# Patient Record
Sex: Male | Born: 1955 | Race: White | Hispanic: No | Marital: Married | Smoking: Never smoker
Health system: Southern US, Community
[De-identification: ages and names within clinical notes are randomized; demographics above are authoritative.]

## PROBLEM LIST (undated history)

## (undated) DIAGNOSIS — G4730 Sleep apnea, unspecified: Secondary | ICD-10-CM

## (undated) DIAGNOSIS — Z9889 Other specified postprocedural states: Secondary | ICD-10-CM

## (undated) DIAGNOSIS — E119 Type 2 diabetes mellitus without complications: Secondary | ICD-10-CM

## (undated) DIAGNOSIS — H9319 Tinnitus, unspecified ear: Secondary | ICD-10-CM

## (undated) DIAGNOSIS — F419 Anxiety disorder, unspecified: Secondary | ICD-10-CM

## (undated) DIAGNOSIS — F32A Depression, unspecified: Secondary | ICD-10-CM

## (undated) DIAGNOSIS — F329 Major depressive disorder, single episode, unspecified: Secondary | ICD-10-CM

## (undated) DIAGNOSIS — R011 Cardiac murmur, unspecified: Secondary | ICD-10-CM

## (undated) DIAGNOSIS — E785 Hyperlipidemia, unspecified: Secondary | ICD-10-CM

## (undated) DIAGNOSIS — I10 Essential (primary) hypertension: Secondary | ICD-10-CM

## (undated) DIAGNOSIS — R112 Nausea with vomiting, unspecified: Secondary | ICD-10-CM

## (undated) HISTORY — PX: HERNIA REPAIR: SHX51

## (undated) HISTORY — PX: LACERATION REPAIR: SHX5168

## (undated) HISTORY — PX: WISDOM TOOTH EXTRACTION: SHX21

## (undated) HISTORY — PX: BACK SURGERY: SHX140

## (undated) HISTORY — PX: COLONOSCOPY: SHX174

## (undated) HISTORY — DX: Sleep apnea, unspecified: G47.30

## (undated) HISTORY — DX: Hyperlipidemia, unspecified: E78.5

## (undated) HISTORY — DX: Essential (primary) hypertension: I10

## (undated) HISTORY — DX: Type 2 diabetes mellitus without complications: E11.9

---

## 1997-11-15 ENCOUNTER — Ambulatory Visit: Admission: RE | Admit: 1997-11-15 | Discharge: 1997-11-15 | Payer: Self-pay | Admitting: Otolaryngology

## 2002-10-24 ENCOUNTER — Emergency Department (HOSPITAL_COMMUNITY): Admission: EM | Admit: 2002-10-24 | Discharge: 2002-10-24 | Payer: Self-pay | Admitting: Emergency Medicine

## 2005-05-05 ENCOUNTER — Ambulatory Visit: Payer: Self-pay | Admitting: Internal Medicine

## 2005-07-03 ENCOUNTER — Ambulatory Visit: Payer: Self-pay | Admitting: Family Medicine

## 2013-05-10 ENCOUNTER — Institutional Professional Consult (permissible substitution): Payer: Self-pay | Admitting: Pulmonary Disease

## 2013-06-07 ENCOUNTER — Encounter: Payer: Self-pay | Admitting: Pulmonary Disease

## 2013-06-07 ENCOUNTER — Ambulatory Visit (INDEPENDENT_AMBULATORY_CARE_PROVIDER_SITE_OTHER): Payer: BC Managed Care – PPO | Admitting: Pulmonary Disease

## 2013-06-07 ENCOUNTER — Encounter (INDEPENDENT_AMBULATORY_CARE_PROVIDER_SITE_OTHER): Payer: Self-pay

## 2013-06-07 VITALS — BP 124/80 | HR 104 | Temp 98.8°F | Ht 73.0 in | Wt 254.0 lb

## 2013-06-07 DIAGNOSIS — G4733 Obstructive sleep apnea (adult) (pediatric): Secondary | ICD-10-CM | POA: Insufficient documentation

## 2013-06-07 NOTE — Patient Instructions (Signed)
Will get you a new cpap machine, and use the automatic setting to optimize your pressure.  If you feel more comfortable on a fixed pressure, let me know Work on weight loss If you are doing well, followup with me again in one year.

## 2013-06-07 NOTE — Assessment & Plan Note (Signed)
The patient has a history of severe obstructive sleep apnea, and has done well with CPAP over the years. He now has an aged CPAP machine, and is in need of a mask and new supplies. He is also gained a large amount of weight since his study, and therefore would take this opportunity to optimize his pressure again. Will give him a new CPAP machine, and use the automatic setting to see how he responds. I've also encouraged him to work aggressively on weight loss.

## 2013-06-07 NOTE — Progress Notes (Signed)
Subjective:    Patient ID: Kerry Reynolds, male    DOB: March 03, 1956, 58 y.o.   MRN: [id number]  HPI The patient is a 58 year old male who comes in today as a self-referral for management of obstructive sleep apnea. He was diagnosed in 1999 with severe OSA, with an AHI of 53 events per hour. He was ultimately titrated to an optimal pressure of 7 cm by split-night protocol. The patient has been on CPAP since that time, and has done very well with the device. However, his machine is now quite old, and not working properly, and he is using tape to hold his CPAP mask together. The patient feels that he is rested overall upon arising, and feels that his alertness during the day is adequate as long as he gets enough sleep at night. He does have sleepiness in the evenings while watching television. He denies any sleepiness with driving.  The patient's weight has increased 59 pounds since 1999, and his Epworth score today is 13.   Sleep Questionnaire What time do you typically go to bed?( Between what hours) 10-11p  10-11p  at 1605 on 06/07/13 by Maisie FusAshtyn M Green, CMA How long does it take you to fall asleep? 5mins 5mins at 1605 on 06/07/13 by Maisie FusAshtyn M Green, CMA How many times during the night do you wake up? 0 0 at 1605 on 06/07/13 by Maisie FusAshtyn M Green, CMA What time do you get out of bed to start your day? 0600 0600 at 1605 on 06/07/13 by Maisie FusAshtyn M Green, CMA Do you drive or operate heavy machinery in your occupation? No No at 1605 on 06/07/13 by Maisie FusAshtyn M Green, CMA How much has your weight changed (up or down) over the past two years? (In pounds) 15 lb (6.804 kg) 15 lb (6.804 kg) at 1605 on 06/07/13 by Maisie FusAshtyn M Green, CMA Have you ever had a sleep study before? Yes Yes at 1605 on 06/07/13 by Maisie FusAshtyn M Green, CMA If yes, location of study? [facility] at 1605 on 06/07/13 by Maisie FusAshtyn M Green, CMA If yes, date of study? 1999 1999 at 1605 on 06/07/13 by Maisie FusAshtyn M Green, CMA Do you currently use CPAP?  Yes Yes at 1605 on 06/07/13 by Maisie FusAshtyn M Green, CMA If so, what pressure? 7 7 at 1605 on 06/07/13 by Maisie FusAshtyn M Green, CMA Do you wear oxygen at any time? No    Review of Systems  Constitutional: Negative for fever and unexpected weight change.  HENT: Negative for congestion, dental problem, ear pain, nosebleeds, postnasal drip, rhinorrhea, sinus pressure, sneezing, sore throat and trouble swallowing.        Recent cold  Eyes: Negative for redness and itching.  Respiratory: Negative for cough, chest tightness, shortness of breath and wheezing.   Cardiovascular: Positive for leg swelling. Negative for palpitations.  Gastrointestinal: Negative for nausea and vomiting.  Genitourinary: Negative for dysuria.  Musculoskeletal: Negative for joint swelling.  Skin: Negative for rash.  Neurological: Negative for headaches.  Hematological: Does not bruise/bleed easily.  Psychiatric/Behavioral: Positive for dysphoric mood. The patient is nervous/anxious.        Treated with meds       Objective:   Physical Exam Constitutional:  Overweight male, no acute distress  HENT:  Nares patent without discharge but narrowed.  Oropharynx without exudate, palate and uvula significantly elongated.   Eyes:  Perrla, eomi, no scleral icterus  Neck:  No JVD, no TMG  Cardiovascular:  Normal rate, regular rhythm, no rubs  or gallops.  No murmurs        Intact distal pulses  Pulmonary :  Normal breath sounds, no stridor or respiratory distress   No rales, rhonchi, or wheezing  Abdominal:  Soft, nondistended, bowel sounds present.  No tenderness noted.   Musculoskeletal:  No lower extremity edema noted.  Lymph Nodes:  No cervical lymphadenopathy noted  Skin:  No cyanosis noted  Neurologic:  Alert, appropriate, moves all 4 extremities without obvious deficit.         Assessment & Plan:

## 2013-06-09 ENCOUNTER — Telehealth: Payer: Self-pay | Admitting: Pulmonary Disease

## 2013-06-09 NOTE — Telephone Encounter (Signed)
Called and spoke with patient and advised patient that [facility] as per his request. Advised patient that if he had any issues tolerating the device once he was set up to give us a call back or [facility]. Nothing else needed at this time. Rhonda J Cobb

## 2013-08-02 ENCOUNTER — Ambulatory Visit: Payer: BC Managed Care – PPO | Admitting: *Deleted

## 2013-09-05 ENCOUNTER — Encounter: Payer: Self-pay | Admitting: *Deleted

## 2013-09-05 ENCOUNTER — Encounter: Payer: BC Managed Care – PPO | Attending: Family Medicine | Admitting: *Deleted

## 2013-09-05 VITALS — Ht 71.75 in | Wt 231.6 lb

## 2013-09-05 DIAGNOSIS — E119 Type 2 diabetes mellitus without complications: Secondary | ICD-10-CM

## 2013-09-05 DIAGNOSIS — Z713 Dietary counseling and surveillance: Secondary | ICD-10-CM | POA: Insufficient documentation

## 2013-09-05 NOTE — Progress Notes (Signed)
Medical Nutrition Therapy:  Appt start time: 0800 end time:  0900.  Assessment:  Primary concern today: type 2 diabetes. Patient newly diagnosed with diabetes 3 months ago with an A1c of 9.1. Since then, he has researched on his own about what to eat and has eliminated sweets, soda, most snacking, and increased exercise. He does report that he gets hungry between lunch and dinner, but his appetite is lower overall. He is scheduled to have A1c retested tomorrow. He checks his blood glucose once daily, alternating between fasting and PM (1 hour after meals) with values around the 120s. He has also been exercising regularly, working up to walking 60 minutes every evening after dinner. He has also lost 22 pounds in the last 3 months.  MEDICATIONS: Invokamet 2 pills BID, for others, see list   DIETARY INTAKE:   Usual eating pattern includes 3 meals and 1 snacks per day.  24-hr recall:  B ( AM): Yogurt, banana, water  Snk ( AM): Oatmeal (1 packet, sweetened) OR cream of wheat with honey L ( PM): Salad (lettuce, ranch, tomatoes, broccoli/cauliflower, egg), soup, water Snk ( PM): None D ( PM): Chicken, vegetables, potatoes  Snk ( PM): None Beverages: Water  Usual physical activity: Walking daily, 52 minutes, working up to 60 minutes  Estimated energy needs: 2000 calories 225 g carbohydrates 125 g protein 67 g fat  Progress Towards Goal(s):  In progress.   Nutritional Diagnosis:  NB-1.1 Food and nutrition-related knowledge deficit As related to newly diagnosed diabeted.  As evidenced by patient report.    Intervention:  Nutrition counseling. We discussed basic carb counting, including foods with carbs, label reading, portion size, and meal planning.   Goals: 1. 3-4 carb servings (+/- 1 serving) per meal, 1 serving per snacks.  2. Monitor portion size of carb foods.  3. Read food labels.  4. Utilize plate method for meal planning.  5. Use basic carb counting to expand food options.  6.  Continue exercising as currently  Handouts given during visit include:  Living Well with Diabetes booklet  Meal plan card  Monitoring/Evaluation:  Dietary intake, exercise, blood glucose, and body weight prn.

## 2014-06-09 ENCOUNTER — Ambulatory Visit: Payer: BC Managed Care – PPO | Admitting: Pulmonary Disease

## 2014-07-11 ENCOUNTER — Ambulatory Visit (INDEPENDENT_AMBULATORY_CARE_PROVIDER_SITE_OTHER): Payer: 59 | Admitting: Pulmonary Disease

## 2014-07-11 ENCOUNTER — Encounter: Payer: Self-pay | Admitting: Pulmonary Disease

## 2014-07-11 VITALS — BP 126/70 | HR 82 | Ht 73.0 in | Wt 242.0 lb

## 2014-07-11 DIAGNOSIS — G4733 Obstructive sleep apnea (adult) (pediatric): Secondary | ICD-10-CM

## 2014-07-11 NOTE — Assessment & Plan Note (Signed)
The patient continues to do very well with his C Pap device, and his download today shows excellent compliance and good control of his AHI. He is having a little bit of a mask leak, and I've asked him to be vigilant about keeping up with cushion changes.  Finally, I have commended him on his weight loss from last year, and asked him to continue working on this.

## 2014-07-11 NOTE — Progress Notes (Signed)
   Subjective:    Patient ID: Regan RakersEdwin L Hankin, male    DOB: [birth year], 59 y.o.   MRN: [id number]  HPI Patient comes in today for follow-up of his obstructive sleep apnea. He is wearing C Pap compliantly by his download, and has great control of his AHI. He is having a little bit of a mask leak issues, but recently replaced his cushions and feels that he is doing well. Satisfied with his sleep and daytime alertness. Note, he has lost 12 pounds since the last visit.   Review of Systems  Constitutional: Negative for fever, chills, activity change, appetite change and unexpected weight change.  HENT: Negative for congestion, dental problem, postnasal drip, rhinorrhea, sneezing, sore throat, trouble swallowing and voice change.   Eyes: Negative for visual disturbance.  Respiratory: Negative for cough, choking and shortness of breath.   Cardiovascular: Negative for chest pain and leg swelling.  Gastrointestinal: Negative for nausea, vomiting and abdominal pain.  Genitourinary: Negative for difficulty urinating.  Musculoskeletal: Negative for arthralgias.  Skin: Negative for rash.  Psychiatric/Behavioral: Negative for behavioral problems and confusion.       Objective:   Physical Exam Overweight male in no acute distress Nose without purulence or discharge noted Neck without lymphadenopathy or thyromegaly No skin breakdown or pressure necrosis from the C Pap mask Lower extremities with mild edema, no cyanosis Alert and oriented, moves all 4 extremities.       Assessment & Plan:

## 2014-07-11 NOTE — Patient Instructions (Signed)
Continue on cpap, and keep up with mask changes and supplies. Work on weight loss followup with me again in one year.  

## 2015-07-17 ENCOUNTER — Ambulatory Visit (INDEPENDENT_AMBULATORY_CARE_PROVIDER_SITE_OTHER): Payer: 59 | Admitting: Pulmonary Disease

## 2015-07-17 ENCOUNTER — Encounter: Payer: Self-pay | Admitting: Pulmonary Disease

## 2015-07-17 VITALS — BP 128/82 | HR 105 | Ht 73.0 in | Wt 250.0 lb

## 2015-07-17 DIAGNOSIS — E669 Obesity, unspecified: Secondary | ICD-10-CM

## 2015-07-17 DIAGNOSIS — G4733 Obstructive sleep apnea (adult) (pediatric): Secondary | ICD-10-CM | POA: Diagnosis not present

## 2015-07-17 DIAGNOSIS — I10 Essential (primary) hypertension: Secondary | ICD-10-CM

## 2015-07-17 DIAGNOSIS — F32A Depression, unspecified: Secondary | ICD-10-CM

## 2015-07-17 DIAGNOSIS — F329 Major depressive disorder, single episode, unspecified: Secondary | ICD-10-CM | POA: Diagnosis not present

## 2015-07-17 NOTE — Patient Instructions (Signed)
1. We will order you supplies for your cpap. Give us a call if you dont get a call in 2-3 weeks. 2. Continue using cpap.  Return to clinic in 1 yr

## 2015-07-17 NOTE — Progress Notes (Signed)
   Subjective:    Patient ID: Kerry Reynolds, male    DOB: 1955-11-11, 60 y.o.   MRN: [id number]  HPI ROV (07/17/2015) Patient is here for follow-up on sleep apnea. Severe. On auto CPAP. Download the last month shows 100% compliance with AHI of 1.6. He feels better using CPAP. No issues with it. Feels benefit of CPAP. Has not received supplies in awhile.    Review of Systems  Constitutional: Negative.   HENT: Positive for congestion and drooling.   Eyes: Negative.   Respiratory: Positive for cough and shortness of breath.   Cardiovascular: Negative.   Gastrointestinal: Negative.   Endocrine: Negative.   Genitourinary: Negative.   Skin: Negative.   Allergic/Immunologic: Negative.   Neurological: Negative.   Hematological: Negative.   Psychiatric/Behavioral: Negative.   All other systems reviewed and are negative.      Objective:   Physical Exam  Vitals:  Filed Vitals:   07/17/15 1601  BP: 128/82  Pulse: 105  Height: 6\' 1"  (1.854 m)  Weight: 250 lb (113.399 kg)  SpO2: 97%    Constitutional/General:  Pleasant, well-nourished, well-developed, not in any distress,  Comfortably seating.  Well kempt obse   Body mass index is 32.99 kg/(m^2). Wt Readings from Last 3 Encounters:  07/17/15 250 lb (113.399 kg)  07/11/14 242 lb (109.77 kg)  09/05/13 231 lb 9.6 oz (105.053 kg)    Neck circumference:   HEENT: Pupils equal and reactive to light and accommodation. Anicteric sclerae. Normal nasal mucosa.   No oral  lesions,  mouth clear,  oropharynx clear, no postnasal drip. (-) Oral thrush. No dental caries.  Airway - Mallampati class III  Neck: No masses. Midline trachea. No JVD, (-) LAD. (-) bruits appreciated.  Respiratory/Chest: Grossly normal chest. (-) deformity. (-) Accessory muscle use.  Symmetric expansion. (-) Tenderness on palpation.  Resonant on percussion.  Diminished BS on both lower lung zones. (-) wheezing, crackles, rhonchi (-) egophony  Cardiovascular:  Regular rate and  rhythm, heart sounds normal, no murmur or gallops, no peripheral edema  Gastrointestinal:  Normal bowel sounds. Soft, non-tender. No hepatosplenomegaly.  (-) masses.   Musculoskeletal:  Normal muscle tone. Normal gait.   Extremities: Grossly normal. (-) clubbing, cyanosis.  (-) edema  Skin: (-) rash,lesions seen.   Neurological/Psychiatric : alert, oriented to time, place, person. Normal mood and affect           Assessment & Plan:  OSA (obstructive sleep apnea) NPSG 1999:  AHI 53/hr, cpap to 7cm.  On auto 5-20cm >> optimal 14cm  Current machine he got in 2015.  Feels better with cpap. More energy. Less sleepiness. No issues with cpap. DL x 3 mos > good. osa corrected.  Cont cpap use. Pt needs supplies. Told pt to wash his supplies regularly.   Obesity Weight reduction.     Return to clinic in 1 yr.  Pollie MeyerJ. Angelo A de Dios, MD 07/18/2015, 8:17 AM [facility] Pager [phone] After 3 pm or if no answer, call [phone]

## 2015-07-17 NOTE — Assessment & Plan Note (Addendum)
NPSG 1999:  AHI 53/hr, cpap to 7cm.  On auto 5-20cm >> optimal 14cm  Current machine he got in 2015.  Feels better with cpap. More energy. Less sleepiness. No issues with cpap. DL x 3 mos > good. osa corrected.  Cont cpap use. Pt needs supplies. Told pt to wash his supplies regularly.

## 2015-07-18 DIAGNOSIS — E669 Obesity, unspecified: Secondary | ICD-10-CM | POA: Insufficient documentation

## 2015-07-18 NOTE — Assessment & Plan Note (Signed)
Weight reduction 

## 2015-08-13 ENCOUNTER — Encounter: Payer: Self-pay | Admitting: Pulmonary Disease

## 2015-10-24 DIAGNOSIS — H6123 Impacted cerumen, bilateral: Secondary | ICD-10-CM | POA: Insufficient documentation

## 2015-10-24 DIAGNOSIS — H9313 Tinnitus, bilateral: Secondary | ICD-10-CM | POA: Insufficient documentation

## 2015-10-24 DIAGNOSIS — H9193 Unspecified hearing loss, bilateral: Secondary | ICD-10-CM | POA: Insufficient documentation

## 2015-12-07 ENCOUNTER — Emergency Department (HOSPITAL_BASED_OUTPATIENT_CLINIC_OR_DEPARTMENT_OTHER)
Admission: EM | Admit: 2015-12-07 | Discharge: 2015-12-07 | Disposition: A | Discharge status: Home / Self Care | Payer: 59 | Attending: Emergency Medicine | Admitting: Emergency Medicine

## 2015-12-07 ENCOUNTER — Encounter (HOSPITAL_BASED_OUTPATIENT_CLINIC_OR_DEPARTMENT_OTHER): Payer: Self-pay

## 2015-12-07 ENCOUNTER — Emergency Department (HOSPITAL_BASED_OUTPATIENT_CLINIC_OR_DEPARTMENT_OTHER): Payer: 59

## 2015-12-07 DIAGNOSIS — I10 Essential (primary) hypertension: Secondary | ICD-10-CM | POA: Diagnosis not present

## 2015-12-07 DIAGNOSIS — R0789 Other chest pain: Secondary | ICD-10-CM | POA: Diagnosis not present

## 2015-12-07 DIAGNOSIS — E119 Type 2 diabetes mellitus without complications: Secondary | ICD-10-CM | POA: Diagnosis not present

## 2015-12-07 DIAGNOSIS — Z7982 Long term (current) use of aspirin: Secondary | ICD-10-CM | POA: Insufficient documentation

## 2015-12-07 DIAGNOSIS — Z79899 Other long term (current) drug therapy: Secondary | ICD-10-CM | POA: Diagnosis not present

## 2015-12-07 LAB — CBC
HCT: 45.7 % (ref 39.0–52.0)
Hemoglobin: 16.3 g/dL (ref 13.0–17.0)
MCH: 31.7 pg (ref 26.0–34.0)
MCHC: 35.7 g/dL (ref 30.0–36.0)
MCV: 88.9 fL (ref 78.0–100.0)
PLATELETS: 181 10*3/uL (ref 150–400)
RBC: 5.14 MIL/uL (ref 4.22–5.81)
RDW: 13 % (ref 11.5–15.5)
WBC: 5.8 10*3/uL (ref 4.0–10.5)

## 2015-12-07 LAB — BASIC METABOLIC PANEL
Anion gap: 10 (ref 5–15)
BUN: 16 mg/dL (ref 6–20)
CALCIUM: 9.4 mg/dL (ref 8.9–10.3)
CO2: 25 mmol/L (ref 22–32)
CREATININE: 0.8 mg/dL (ref 0.61–1.24)
Chloride: 103 mmol/L (ref 101–111)
GFR calc non Af Amer: >60 mL/min (ref >60)
Glucose, Bld: 116 mg/dL — ABNORMAL HIGH (ref 65–99)
Potassium: 3.9 mmol/L (ref 3.5–5.1)
SODIUM: 138 mmol/L (ref 135–145)

## 2015-12-07 LAB — CBG MONITORING, ED: Glucose-Capillary: 114 mg/dL — ABNORMAL HIGH (ref 65–99)

## 2015-12-07 LAB — TROPONIN I

## 2015-12-07 MED ORDER — GI COCKTAIL ~~LOC~~
30.0000 mL | Freq: Once | ORAL | Status: DC
  Filled 2015-12-07: qty 30

## 2015-12-07 NOTE — Discharge Instructions (Signed)
Try zantac 150mg twice a day.  ° ° °

## 2015-12-07 NOTE — ED Provider Notes (Signed)
[facility] Provider Note   CSN: [id number] Arrival date & time: 12/07/15  1340     History   Chief Complaint Chief Complaint  Patient presents with  . Chest Pain    HPI Kerry Reynolds is a 60 y.o. male.  60 yo M with a chief complaint of epigastric pain. This started after he had a fatty biscuit. Patient states that the pain is burning lasts for 5 seconds at a time and quickly resolves. 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 They suggested he call his family physician who then told him to come immediately to the emergency department. Denies fevers or chills. Denies radiation of pain. Denies shortness of breath diaphoresis or exertional symptoms. He has had some trace edema denies unilateral swelling denies hemoptysis prior history of PE or DVT. Denies recent surgery.   The history is provided by the patient.  Chest Pain   This is a new problem. The current episode started less than 1 hour ago. The problem occurs rarely. The problem has not changed since onset.The pain is associated with rest. The pain is present in the epigastric region. The pain is at a severity of 5/10. The pain is moderate. The quality of the pain is described as brief. The pain does not radiate. Duration of episode(s) is 5 seconds. Pertinent negatives include no abdominal pain, no fever, no headaches, no palpitations, no shortness of breath and no vomiting. He has tried antacids for the symptoms. The treatment provided mild relief. Risk factors include obesity and male gender.  Pertinent negatives for past medical history include no CAD, no COPD, no CHF, no MI, no PE and no PVD.    Past Medical History:  Diagnosis Date  . Diabetes (HCC)   . HTN (hypertension)   . Hyperlipidemia   . Sleep apnea     Patient Active Problem List   Diagnosis Date Noted  . Obesity 07/18/2015  . OSA (obstructive sleep apnea) 06/07/2013    Past Surgical History:   Procedure Laterality Date  . BACK SURGERY     lower back  . HERNIA REPAIR     [employer]  . LACERATION REPAIR Right    arm  . WISDOM TOOTH EXTRACTION         Home Medications    Prior to Admission medications   Medication Sig Start Date End Date Taking? Authorizing Provider  amLODipine-valsartan (EXFORGE) 5-320 MG tablet Take 1 tablet by mouth daily. 07/14/15   Historical Provider, MD  aspirin 81 MG tablet Take 81 mg by mouth daily.    Historical Provider, MD  buPROPion (WELLBUTRIN XL) 300 MG 24 hr tablet Take 1 tablet by mouth daily. 07/14/15   Historical Provider, MD  FLUoxetine (PROZAC) 20 MG capsule Take 1 capsule by mouth daily. 07/14/15   Historical Provider, MD  hydrochlorothiazide (HYDRODIURIL) 25 MG tablet Take 0.5 tablets by mouth daily. 07/14/15   Historical Provider, MD  INVOKAMET [id number] MG TABS 1 tablet daily. 05/07/14   Historical Provider, MD  pravastatin (PRAVACHOL) 40 MG tablet Take 1 tablet by mouth daily. 07/02/15   Historical Provider, MD  Red Yeast Rice Extract (RED YEAST RICE PO) Take 2 tablets by mouth daily.    Historical Provider, MD    Family History Family History  Problem Relation Age of Onset  . Breast cancer Mother   . Diabetes Maternal Grandmother     Social History Social History  Substance Use Topics  . Smoking status: Never Smoker  . Smokeless tobacco: Never Used  . Alcohol use No     Allergies   Review of patient's allergies indicates no known allergies.   Review of Systems Review of Systems  Constitutional: Negative for chills and fever.  HENT: Negative for congestion and facial swelling.   Eyes: Negative for discharge and visual disturbance.  Respiratory: Negative for shortness of breath.   Cardiovascular: Positive for chest pain. Negative for palpitations.  Gastrointestinal: Negative for abdominal pain, diarrhea and vomiting.  Musculoskeletal: Negative for arthralgias and myalgias.  Skin: Negative for color change and rash.    Neurological: Negative for tremors, syncope and headaches.  Psychiatric/Behavioral: Negative for confusion and dysphoric mood.     Physical Exam Updated Vital Signs BP 120/72   Pulse 87   Temp 98.2 F (36.8 C) (Oral)   Resp 20   Ht 6\' 1"  (1.854 m)   Wt 245 lb (111.1 kg)   SpO2 99%   BMI 32.32 kg/m   Physical Exam  Constitutional: He is oriented to person, place, and time. He appears well-developed and well-nourished.  HENT:  Head: Normocephalic and atraumatic.  Eyes: Conjunctivae and EOM are normal. Pupils are equal, round, and reactive to light.  Neck: Normal range of motion. No JVD present.  Cardiovascular: Normal rate and regular rhythm.   Pulmonary/Chest: Effort normal. No stridor. No respiratory distress.  Abdominal: He exhibits no distension. There is no tenderness. There is no guarding.  Musculoskeletal: Normal range of motion. He exhibits no edema.  Neurological: He is alert and oriented to person, place, and time.  Skin: Skin is warm and dry.  Psychiatric: He has a normal mood and affect. His behavior is normal.     ED Treatments / Results  Labs (all labs ordered are listed, but only abnormal results are displayed) Labs Reviewed  BASIC METABOLIC PANEL - Abnormal; Notable for the following:       Result Value   Glucose, Bld 116 (*)    All other components within normal limits  CBG MONITORING, ED - Abnormal; Notable for the following:    Glucose-Capillary 114 (*)    All other components within normal limits  CBC  TROPONIN I    EKG  EKG Interpretation  Date/Time:  Friday December 07 2015 13:46:00 EDT Ventricular Rate:  81 PR Interval:    QRS Duration: 93 QT Interval:  346 QTC Calculation: 402 R Axis:   -24 Text Interpretation:  Sinus rhythm Supraventricular bigeminy Borderline left axis deviation Low voltage, precordial leads Abnormal R-wave progression, late transition No old tracing to compare Confirmed by Itza Maniaci MD, DANIEL [phone]) on 12/07/2015  1:57:34 PM       Radiology Dg Chest 2 View  Result Date: 12/07/2015 CLINICAL DATA:  Chest tightness.  Diabetes.  Hypertension. EXAM: CHEST  2 VIEW COMPARISON:  None. FINDINGS: S shaped thoracic scoliosis. Mildly tortuous thoracic aorta, heart size within normal limits. Linear subsegmental atelectasis or scarring in the lingula. No pleural effusion or additional airspace opacity identified. IMPRESSION: 1. Scoliosis with some associated rib deformity. This appears chronic. 2. Subsegmental atelectasis or scarring in the lingula, otherwise the lungs appear clear. 3. Mildly tortuous thoracic aorta. Electronically Signed   By: Gaylyn Rong M.D.   On: 12/07/2015 14:07    Procedures Procedures (including critical care time)  Medications Ordered in ED Medications  gi cocktail (Maalox,Lidocaine,Donnatal) (30 mLs Oral Refused 12/07/15 1445)     Initial Impression / Assessment and  Plan / ED Course  I have reviewed the triage vital signs and the nursing notes.  Pertinent labs & imaging results that were available during my care of the patient were reviewed by me and considered in my medical decision making (see chart for details).  Clinical Course    60 yo M With epigastric pain after eating fatty food. Patient with no significant findings on exam. Pain is transiently last for 5 seconds at a time. I feel this is completely atypical of cardiac disease. Patient had labs drawn through triage which are negative. An EKG and a chest x-ray which do not show any acute findings.  D/c home.   2:45 PM:  I have discussed the diagnosis/risks/treatment options with the patient and family and believe the pt to be eligible for discharge home to follow-up with PCP. We also discussed returning to the ED immediately if new or worsening sx occur. We discussed the sx which are most concerning (e.g., sudden worsening pain, fever, inability to tolerate by mouth) that necessitate immediate return. Medications  administered to the patient during their visit and any new prescriptions provided to the patient are listed below.  Medications given during this visit Medications  gi cocktail (Maalox,Lidocaine,Donnatal) (30 mLs Oral Refused 12/07/15 1445)     The patient appears reasonably screen and/or stabilized for discharge and I doubt any other medical condition or other [facility] requiring further screening, evaluation, or treatment in the ED at this time prior to discharge.    Final Clinical Impressions(s) / ED Diagnoses   Final diagnoses:  Atypical chest pain    New Prescriptions New Prescriptions   No medications on file     Melene Plan, DO 12/07/15 1446

## 2016-02-14 ENCOUNTER — Ambulatory Visit (INDEPENDENT_AMBULATORY_CARE_PROVIDER_SITE_OTHER): Payer: Worker's Compensation

## 2016-02-14 ENCOUNTER — Encounter (INDEPENDENT_AMBULATORY_CARE_PROVIDER_SITE_OTHER): Payer: Self-pay | Admitting: Orthopaedic Surgery

## 2016-02-14 ENCOUNTER — Ambulatory Visit (INDEPENDENT_AMBULATORY_CARE_PROVIDER_SITE_OTHER): Payer: Worker's Compensation | Admitting: Orthopaedic Surgery

## 2016-02-14 VITALS — BP 147/84 | HR 103 | Ht 72.0 in | Wt 245.0 lb

## 2016-02-14 DIAGNOSIS — M25511 Pain in right shoulder: Secondary | ICD-10-CM | POA: Diagnosis not present

## 2016-02-14 MED ORDER — HYDROCODONE-ACETAMINOPHEN 5-325 MG PO TABS: 1.0000 | ORAL_TABLET | Freq: Four times a day (QID) | ORAL | 0 refills | Status: DC | PRN

## 2016-02-14 NOTE — Progress Notes (Signed)
   Office Visit Note   Patient: Kerry Reynolds           Date of Birth: Jun 07, 1955           MRN: [id number] Visit Date: 02/14/2016              Requested by: Elias Elseobert Reade, MD [phone] [place], [place] [address] PCP: Lolita PatellaEADE,ROBERT ALEXANDER, MD   Assessment & Plan: Visit Diagnoses: No diagnosis found.  Plan:   Follow-Up Instructions: No Follow-up on file.   Orders:  No orders of the defined types were placed in this encounter.  No orders of the defined types were placed in this encounter.     Procedures: No procedures performed   Clinical Data: No additional findings.   Subjective: No chief complaint on file.   Pt fell Tuesday, 02/12/16 backwards on to a wall, fell straight down and when he tried to get up he could not raise the Right arm/shoulder because it hurt so bad.   He continues to have pain to the point of compromise with inability to raise his right arm over his head. He denies any numbness or tingling or skin changes. He denies any neck pain. He also denies any prior problems with the same shoulder.  Review of Systems   Objective: Vital Signs: There were no vitals taken for this visit.  Physical Exam  Ortho Exam the right shoulder was not swollen there was no evidence of ecchymosis neurologically was intact. He was unable to raise his arm over his head related to pain. I thought that the biceps muscle was in a lower position possibly consistent with a biceps tendon rupture. His sensation was intact and he had normal motor function of his right hand.  Specialty Comments:  No specialty comments available.  Imaging: No results found.   PMFS History: Patient Active Problem List   Diagnosis Date Noted  . Obesity 07/18/2015  . OSA (obstructive sleep apnea) 06/07/2013   Past Medical History:  Diagnosis Date  . Diabetes (HCC)   . HTN (hypertension)   . Hyperlipidemia   . Sleep apnea     Family History  Problem Relation Age of  Onset  . Breast cancer Mother   . Diabetes Maternal Grandmother     Past Surgical History:  Procedure Laterality Date  . BACK SURGERY     lower back  . [employer]ERNIA REPAIR     [employer]  . LACERATION REPAIR Right    arm  . WISDOM TOOTH EXTRACTION     Social History   Occupational History  . teacher    Social History Main Topics  . Smoking status: Never Smoker  . Smokeless tobacco: Never Used  . Alcohol use No  . Drug use: No  . Sexual activity: Not on file

## 2016-02-18 ENCOUNTER — Telehealth (INDEPENDENT_AMBULATORY_CARE_PROVIDER_SITE_OTHER): Payer: Self-pay | Admitting: *Deleted

## 2016-02-18 NOTE — Telephone Encounter (Signed)
Pt has appt for MRI on Saturday Nov 18th at 10am with 930am [facility] at [facility], left message on vm to return my call for appt information.

## 2016-02-18 NOTE — Telephone Encounter (Signed)
Pt called back and aware of appt 

## 2016-02-23 ENCOUNTER — Ambulatory Visit (HOSPITAL_COMMUNITY)
Admission: RE | Admit: 2016-02-23 | Discharge: 2016-02-23 | Disposition: A | Discharge status: Home / Self Care | Payer: No Typology Code available for payment source | Source: Ambulatory Visit | Attending: Orthopaedic Surgery | Admitting: Orthopaedic Surgery

## 2016-02-23 DIAGNOSIS — M25511 Pain in right shoulder: Secondary | ICD-10-CM

## 2016-02-23 DIAGNOSIS — M7551 Bursitis of right shoulder: Secondary | ICD-10-CM | POA: Insufficient documentation

## 2016-02-23 DIAGNOSIS — M19011 Primary osteoarthritis, right shoulder: Secondary | ICD-10-CM | POA: Diagnosis not present

## 2016-02-23 DIAGNOSIS — S46811A Strain of other muscles, fascia and tendons at shoulder and upper arm level, right arm, initial encounter: Secondary | ICD-10-CM | POA: Insufficient documentation

## 2016-02-23 DIAGNOSIS — W19XXXA Unspecified fall, initial encounter: Secondary | ICD-10-CM | POA: Diagnosis not present

## 2016-02-25 NOTE — H&P (Signed)
Kerry CampbellPeter Whitfield, MD   Kerry CodeBrian Braxley Balandran, PA-C [address], [place], [place]  [address]                             [phone]   ORTHOPAEDIC HISTORY & PHYSICAL  Kerry Reynolds MRN:  [id number] DOB/SEX:  1956/02/18/male  CHIEF COMPLAINT:  Painful right shoulder  HISTORY: Pt fell Tuesday, 02/12/16 backwards on to a wall, fell straight down and when he tried to get up he could not raise the Right arm/shoulder because it hurt so bad.  He continues to have pain to the point of compromise with inability to raise his right arm over his head. He denies any numbness or tingling or skin changes. He denies any neck pain. He also denies any prior problems with the same shoulder.  MRI was ordered Rotator cuff: The patient has complete supraspinatus and infraspinatus tendon tears with retraction to the top of the humeral head, 2-3 cm. Subscapularis and teres minor are intact.  Muscles:  No atrophy or focal lesion.  Biceps long head:  Intact.  Acromioclavicular Joint: Moderate degenerative change is seen. Type 2 acromion. Prominent fluid is seen in the subacromial/subdeltoid bursa.  Glenohumeral Joint: Unremarkable.  Labrum:  Intact.  Bones:  No fracture or worrisome lesion.  Other: None  IMPRESSION: Complete supraspinatus and infraspinatus tendon tears with 2-3 cm of retraction but no atrophy.  Moderate acromioclavicular osteoarthritis.  Subacromial/subdeltoid bursitis.   PAST MEDICAL HISTORY: Patient Active Problem List   Diagnosis Date Noted  . Obesity 07/18/2015  . OSA (obstructive sleep apnea) 06/07/2013   Past Medical History:  Diagnosis Date  . Diabetes (HCC)   . HTN (hypertension)   . Hyperlipidemia   . Sleep apnea    Past Surgical History:  Procedure Laterality Date  . BACK SURGERY     lower back  . HERNIA REPAIR     [employer]  . LACERATION REPAIR Right    arm  . WISDOM TOOTH EXTRACTION       MEDICATIONS:   No current facility-administered  medications for this encounter.    Current Outpatient Prescriptions  Medication Sig Dispense Refill  . amLODipine-valsartan (EXFORGE) 5-320 MG tablet Take 1 tablet by mouth daily.    Marland Kitchen. aspirin 81 MG tablet Take 81 mg by mouth daily.    Marland Kitchen. buPROPion (WELLBUTRIN XL) 300 MG 24 hr tablet Take 1 tablet by mouth daily.    Marland Kitchen. FLUoxetine (PROZAC) 20 MG capsule Take 1 capsule by mouth daily.    . hydrochlorothiazide (HYDRODIURIL) 25 MG tablet Take 12.5 mg by mouth daily.     Marland Kitchen. HYDROcodone-acetaminophen (NORCO/VICODIN) 5-325 MG tablet Take 1-2 tablets by mouth every 6 (six) hours as needed for moderate pain. 20 tablet 0  . INVOKAMET [phone] MG TABS Take 1 tablet by mouth 2 (two) times daily.     . Multiple Vitamin (MULTIVITAMIN) capsule Take by mouth.    . pravastatin (PRAVACHOL) 40 MG tablet Take 1 tablet by mouth every evening.        ALLERGIES:  No Known Allergies  REVIEW OF SYSTEMS:  A comprehensive review of systems was negative.   FAMILY HISTORY:   Family History  Problem Relation Age of Onset  . Breast cancer Mother   . Diabetes Maternal Grandmother     SOCIAL HISTORY:   Social History  Substance Use Topics  . Smoking status: Never Smoker  . Smokeless tobacco: Never Used  . Alcohol  use No      EXAMINATION:   Head is normocephalic.   Eyes:  Pupils equal, round and reactive to light and accommodation.  Extraocular intact. ENT: Ears, nose, and throat were benign.   Neck: supple, no bruits were noted.   Chest: good expansion.   Lungs: essentially clear.   Cardiac: regular rhythm and rate, normal S1, S2.  No murmurs appreciated. Pulses :  2+ bilateral and symmetric in upper extremities. Abdomen is scaphoid, soft, nontender, no masses palpable, normal bowel sounds  present. CNS:  He is oriented x3 and cranial nerves II-XII grossly intact. Breast, rectal, and genital exams: not performed and not indicated for an orthopedic evaluation. Musculoskeletal: Ortho Exam the right  shoulder was not swollen there was no evidence of ecchymosis neurologically was intact. He was unable to raise his arm over his head related to pain. I thought that the biceps muscle was in a lower position possibly consistent with a biceps tendon rupture. His sensation was intact and he had normal motor function of his right hand.   Imaging Review Rotator cuff: The patient has complete supraspinatus and infraspinatus tendon tears with retraction to the top of the humeral head, 2-3 cm. Subscapularis and teres minor are intact.  Muscles:  No atrophy or focal lesion.  Biceps long head:  Intact.  Acromioclavicular Joint: Moderate degenerative change is seen. Type 2 acromion. Prominent fluid is seen in the subacromial/subdeltoid bursa.  Glenohumeral Joint: Unremarkable.  Labrum:  Intact.  Bones:  No fracture or worrisome lesion.  Other: None  IMPRESSION: Complete supraspinatus and infraspinatus tendon tears with 2-3 cm of retraction but no atrophy.  Moderate acromioclavicular osteoarthritis.  Subacromial/subdeltoid bursitis.   ASSESSMENT: right impingment syndrome with rotator cuff tear  Past Medical History:  Diagnosis Date  . Diabetes (HCC)   . HTN (hypertension)   . Hyperlipidemia   . Sleep apnea     PLAN: Plan for right SHOULDER ARTHROSCOPY WITH SUBACROMIAL DECOMPRESSION, DISTAL CLAVICLE RESECTION, MINI-OPEN ROTATOR CUFF REPAIR, [facility]  The procedure,  risks, and benefits of surgery were presented and reviewed. The risks including but not limited to infection, blood clots, vascular and nerve injury, stiffness,  among others were discussed. The patient acknowledged the explanation, agreed to proceed.   Oris DroneBrian D. Aleda Granaetrarca, PA-C [facility] [phone]  02/25/2016 7:56 PM

## 2016-02-27 ENCOUNTER — Other Ambulatory Visit (INDEPENDENT_AMBULATORY_CARE_PROVIDER_SITE_OTHER): Payer: Self-pay | Admitting: Orthopaedic Surgery

## 2016-02-27 DIAGNOSIS — M25511 Pain in right shoulder: Principal | ICD-10-CM

## 2016-02-27 DIAGNOSIS — G8929 Other chronic pain: Secondary | ICD-10-CM

## 2016-02-27 MED ORDER — HYDROCODONE-ACETAMINOPHEN 5-325 MG PO TABS: 1.0000 | ORAL_TABLET | Freq: Four times a day (QID) | ORAL | 0 refills | Status: DC | PRN

## 2016-02-27 NOTE — Telephone Encounter (Signed)
Patient says he is scheduled for shoulder surgery on 03/06/16. He is requesting additional pain medication to last for the next week leading up to surgery. Please advise.

## 2016-02-27 NOTE — Telephone Encounter (Signed)
Per PW, wrote rx for 20 pills.

## 2016-03-05 ENCOUNTER — Encounter (HOSPITAL_COMMUNITY): Payer: Self-pay

## 2016-03-05 ENCOUNTER — Encounter (HOSPITAL_COMMUNITY)
Admission: RE | Admit: 2016-03-05 | Discharge: 2016-03-05 | Disposition: A | Discharge status: Home / Self Care | Payer: 59 | Source: Ambulatory Visit | Attending: Orthopaedic Surgery | Admitting: Orthopaedic Surgery

## 2016-03-05 DIAGNOSIS — E119 Type 2 diabetes mellitus without complications: Secondary | ICD-10-CM | POA: Diagnosis not present

## 2016-03-05 DIAGNOSIS — M65811 Other synovitis and tenosynovitis, right shoulder: Secondary | ICD-10-CM | POA: Diagnosis not present

## 2016-03-05 DIAGNOSIS — Z7982 Long term (current) use of aspirin: Secondary | ICD-10-CM | POA: Diagnosis not present

## 2016-03-05 DIAGNOSIS — Z7984 Long term (current) use of oral hypoglycemic drugs: Secondary | ICD-10-CM | POA: Diagnosis not present

## 2016-03-05 DIAGNOSIS — G4733 Obstructive sleep apnea (adult) (pediatric): Secondary | ICD-10-CM | POA: Diagnosis not present

## 2016-03-05 DIAGNOSIS — E669 Obesity, unspecified: Secondary | ICD-10-CM | POA: Diagnosis not present

## 2016-03-05 DIAGNOSIS — F329 Major depressive disorder, single episode, unspecified: Secondary | ICD-10-CM | POA: Diagnosis not present

## 2016-03-05 DIAGNOSIS — M7541 Impingement syndrome of right shoulder: Secondary | ICD-10-CM | POA: Diagnosis not present

## 2016-03-05 DIAGNOSIS — M75111 Incomplete rotator cuff tear or rupture of right shoulder, not specified as traumatic: Secondary | ICD-10-CM | POA: Diagnosis not present

## 2016-03-05 DIAGNOSIS — F419 Anxiety disorder, unspecified: Secondary | ICD-10-CM | POA: Diagnosis not present

## 2016-03-05 DIAGNOSIS — E785 Hyperlipidemia, unspecified: Secondary | ICD-10-CM | POA: Diagnosis not present

## 2016-03-05 DIAGNOSIS — Z833 Family history of diabetes mellitus: Secondary | ICD-10-CM | POA: Diagnosis not present

## 2016-03-05 DIAGNOSIS — Z6832 Body mass index (BMI) 32.0-32.9, adult: Secondary | ICD-10-CM | POA: Diagnosis not present

## 2016-03-05 DIAGNOSIS — M19011 Primary osteoarthritis, right shoulder: Secondary | ICD-10-CM | POA: Diagnosis not present

## 2016-03-05 DIAGNOSIS — Z888 Allergy status to other drugs, medicaments and biological substances status: Secondary | ICD-10-CM | POA: Diagnosis not present

## 2016-03-05 DIAGNOSIS — I10 Essential (primary) hypertension: Secondary | ICD-10-CM | POA: Diagnosis not present

## 2016-03-05 DIAGNOSIS — Z803 Family history of malignant neoplasm of breast: Secondary | ICD-10-CM | POA: Diagnosis not present

## 2016-03-05 HISTORY — DX: Nausea with vomiting, unspecified: Z98.890

## 2016-03-05 HISTORY — DX: Depression, unspecified: F32.A

## 2016-03-05 HISTORY — DX: Nausea with vomiting, unspecified: R11.2

## 2016-03-05 HISTORY — DX: Tinnitus, unspecified ear: H93.19

## 2016-03-05 HISTORY — DX: Anxiety disorder, unspecified: F41.9

## 2016-03-05 HISTORY — DX: Major depressive disorder, single episode, unspecified: F32.9

## 2016-03-05 HISTORY — DX: Cardiac murmur, unspecified: R01.1

## 2016-03-05 LAB — CBC
HEMATOCRIT: 44.5 % (ref 39.0–52.0)
HEMOGLOBIN: 15.4 g/dL (ref 13.0–17.0)
MCH: 31.6 pg (ref 26.0–34.0)
MCHC: 34.6 g/dL (ref 30.0–36.0)
MCV: 91.4 fL (ref 78.0–100.0)
Platelets: 195 10*3/uL (ref 150–400)
RBC: 4.87 MIL/uL (ref 4.22–5.81)
RDW: 13 % (ref 11.5–15.5)
WBC: 5.4 10*3/uL (ref 4.0–10.5)

## 2016-03-05 LAB — BASIC METABOLIC PANEL
Anion gap: 10 (ref 5–15)
BUN: 13 mg/dL (ref 6–20)
CHLORIDE: 103 mmol/L (ref 101–111)
CO2: 25 mmol/L (ref 22–32)
Calcium: 9.8 mg/dL (ref 8.9–10.3)
Creatinine, Ser: 0.96 mg/dL (ref 0.61–1.24)
GFR calc non Af Amer: >60 mL/min (ref >60)
Glucose, Bld: 205 mg/dL — ABNORMAL HIGH (ref 65–99)
POTASSIUM: 4 mmol/L (ref 3.5–5.1)
SODIUM: 138 mmol/L (ref 135–145)

## 2016-03-05 LAB — GLUCOSE, CAPILLARY: GLUCOSE-CAPILLARY: 222 mg/dL — AB (ref 65–99)

## 2016-03-05 NOTE — Pre-Procedure Instructions (Signed)
Kerry Reynolds  03/05/2016      [facility] [place] [place] [address] Phone: [phone] Fax: [phone]    Your procedure is scheduled on Thursday, November 30th, 2017.  [facility] at 11:15 A.M.   Call this number if you have problems the morning of surgery:  [phone]   Remember:  Do not eat food or drink liquids after midnight.   Take these medicines the morning of surgery with A SIP OF WATER: Bupropion (Wellbutrin), Fluoxetine (Prozac), Hydrocodone-acetaminophen (Norco) if needed.   Stop taking: Aspirin, NSAIDS, Aleve, Naproxen, Ibuprofen, Advil, Motrin, BC's, Goody's, Fish oil, all herbal medications, and all vitamins.    WHAT DO I DO ABOUT MY DIABETES MEDICATION?  Marland Kitchen. Do not take oral diabetes medicines (pills) the morning of surgery.  Do NOT take Invokamet the morning of surgery.    How to Manage Your Diabetes Before and After Surgery  Why is it important to control my blood sugar before and after surgery? . Improving blood sugar levels before and after surgery helps healing and can limit problems. . A way of improving blood sugar control is eating a healthy diet by: o  Eating less sugar and carbohydrates o  Increasing activity/exercise o  Talking with your doctor about reaching your blood sugar goals . High blood sugars (greater than 180 mg/dL) can raise your risk of infections and slow your recovery, so you will need to focus on controlling your diabetes during the weeks before surgery. . Make sure that the doctor who takes care of your diabetes knows about your planned surgery including the date and location.  How do I manage my blood sugar before surgery? . Check your blood sugar at least 4 times a day, starting 2 days before surgery, to make sure that the level is not too high or low. o Check your blood sugar the morning of your surgery when you  wake up and every 2 hours until you get to the Short Stay unit. . If your blood sugar is less than 70 mg/dL, you will need to treat for low blood sugar: o Do not take insulin. o Treat a low blood sugar (less than 70 mg/dL) with  cup of clear juice (cranberry or apple), 4 glucose tablets, OR glucose gel. o Recheck blood sugar in 15 minutes after treatment (to make sure it is greater than 70 mg/dL). If your blood sugar is not greater than 70 mg/dL on recheck, call [phone] for further instructions. . Report your blood sugar to the short stay nurse when you get to Short Stay.  . If you are admitted to the hospital after surgery: o Your blood sugar will be checked by the staff and you will probably be given insulin after surgery (instead of oral diabetes medicines) to make sure you have good blood sugar levels. o The goal for blood sugar control after surgery is 80-180 mg/dL.    Do not wear jewelry.  Do not wear lotions, powders, or colognes, or deoderant.  Men may shave face and neck.  Do not bring valuables to the hospital.  [facility] is not responsible for any belongings or valuables.  Contacts, dentures or bridgework may not be worn into surgery.  Leave your suitcase in the car.  After surgery it may be brought to your room.  For patients admitted to the hospital, discharge time will be determined by  your treatment team.  Patients discharged the day of surgery will not be allowed to drive home.   Special instructions:  Preparing for Surgery.   [facility]- Preparing For Surgery  Before surgery, you can play an important role. Because skin is not sterile, your skin needs to be as free of germs as possible. You can reduce the number of germs on your skin by washing with CHG (chlorahexidine gluconate) Soap before surgery.  CHG is an antiseptic cleaner which kills germs and bonds with the skin to continue killing germs even after washing.  Please do not use if you have an allergy to  CHG or antibacterial soaps. If your skin becomes reddened/irritated stop using the CHG.  Do not shave (including legs and underarms) for at least 48 hours prior to first CHG shower. It is OK to shave your face.  Please follow these instructions carefully.   1. Shower the NIGHT BEFORE SURGERY and the MORNING OF SURGERY with CHG.   2. If you chose to wash your hair, wash your hair first as usual with your normal shampoo.  3. After you shampoo, rinse your hair and body thoroughly to remove the shampoo.  4. Use CHG as you would any other liquid soap. You can apply CHG directly to the skin and wash gently with a scrungie or a clean washcloth.   5. Apply the CHG Soap to your body ONLY FROM THE NECK DOWN.  Do not use on open wounds or open sores. Avoid contact with your eyes, ears, mouth and genitals (private parts). Wash genitals (private parts) with your normal soap.  6. Wash thoroughly, paying special attention to the area where your surgery will be performed.  7. Thoroughly rinse your body with warm water from the neck down.  8. DO NOT shower/wash with your normal soap after using and rinsing off the CHG Soap.  9. Pat yourself dry with a CLEAN TOWEL.   10. Wear CLEAN PAJAMAS   11. Place CLEAN SHEETS on your bed the night of your first shower and DO NOT SLEEP WITH PETS.  Day of Surgery: Do not apply any deodorants/lotions. Please wear clean clothes to the hospital/surgery center.     Please read over the following fact sheets that you were given

## 2016-03-05 NOTE — Progress Notes (Signed)
PCP - Dr. Elias Elseobert Reade Cardiologist - denies  EKG - 12/11/15 CXR - 12/07/15  Echo/stress test/cardiac cath - pt. Denies  Patient denies chest pain and shortness of breath at PAT appointment.

## 2016-03-05 NOTE — Progress Notes (Signed)
Anesthesia Chart Review: Patient is a 60 year old male scheduled for right shoulder arthroscopy with subacromial decompression, distal clavicle resection, mini open rotator cuff repair, possible Dermaspan patch on 03/06/16 by Dr. Cleophas DunkerWhitfield.   History includes non-smoker, post-operative N/V, HTN, HLD, OSA (CPAP), DM2, murmur, anxiety, depression, back surgery, hernia repair. BMI is consistent with obesity. He had an ED visit on 12/07/15 for chest pain after eating fatty food. It did not resolve with Tums, so he presented to the ED. By notes, pain would transiently last for 5 seconds at a time, so the ED physician felt it was atypical for cardiac etiology. Troponin was negative. He was given a GI cocktail and discharged home.   PCP is Dr. Elias Elseobert Reade.  Pulmonologist is Dr. Delphina CahillJose Angelo A de RichmondDios. Patient denied seeing a cardiologist. He denied prior stress, echo, or cath. He denied CP and SOB at PAT.   Meds includes Exforge, ASA 81 mg, Wellbutrin XL, Prozac, HCTZ, Norco, Invokamet, MVI, pravastatin.  BP 125/78   Pulse 97   Temp 36.7 C   Resp 20   Ht 6\' 1"  (1.854 m)   Wt 244 lb 8 oz (110.9 kg)   SpO2 97%   BMI 32.26 kg/m   12/07/15 EKG: SR, frequent PACs, borderline LAD, low voltage, precordial leads, abnormal R wave progression, late transition.  12/07/15 CXR: IMPRESSION: 1. Scoliosis with some associated rib deformity. This appears chronic. 2. Subsegmental atelectasis or scarring in the lingula, otherwise the lungs appear clear. 3. Mildly tortuous thoracic aorta.  Preoperative labs noted. Cr 0.96, glucose 205. CBC WNL. A1c in process.   Discussed with anesthesiologist Dr. Okey Dupreose. If he remains asymptomatic from a CV standpoint then it is anticipated that he can proceed as planned. Anesthesiologist to evaluate on the day of surgery.  Kerry Ochsllison Zelenak, PA-C [facility] Phone [phone] 03/05/2016 2:36 PM

## 2016-03-06 ENCOUNTER — Ambulatory Visit (HOSPITAL_COMMUNITY): Payer: 59 | Admitting: Vascular Surgery

## 2016-03-06 ENCOUNTER — Encounter (HOSPITAL_COMMUNITY)
Admission: RE | Disposition: A | Discharge status: Home / Self Care | Payer: Self-pay | Source: Ambulatory Visit | Attending: Orthopaedic Surgery

## 2016-03-06 ENCOUNTER — Encounter (HOSPITAL_COMMUNITY): Payer: Self-pay | Admitting: *Deleted

## 2016-03-06 ENCOUNTER — Ambulatory Visit (HOSPITAL_COMMUNITY): Payer: 59 | Admitting: Certified Registered Nurse Anesthetist

## 2016-03-06 ENCOUNTER — Ambulatory Visit (HOSPITAL_COMMUNITY)
Admission: RE | Admit: 2016-03-06 | Discharge: 2016-03-06 | Disposition: A | Discharge status: Home / Self Care | Payer: 59 | Source: Ambulatory Visit | Attending: Orthopaedic Surgery | Admitting: Orthopaedic Surgery

## 2016-03-06 DIAGNOSIS — G4733 Obstructive sleep apnea (adult) (pediatric): Secondary | ICD-10-CM | POA: Insufficient documentation

## 2016-03-06 DIAGNOSIS — F419 Anxiety disorder, unspecified: Secondary | ICD-10-CM | POA: Insufficient documentation

## 2016-03-06 DIAGNOSIS — M7541 Impingement syndrome of right shoulder: Secondary | ICD-10-CM | POA: Insufficient documentation

## 2016-03-06 DIAGNOSIS — Z7982 Long term (current) use of aspirin: Secondary | ICD-10-CM | POA: Insufficient documentation

## 2016-03-06 DIAGNOSIS — E119 Type 2 diabetes mellitus without complications: Secondary | ICD-10-CM | POA: Insufficient documentation

## 2016-03-06 DIAGNOSIS — M75121 Complete rotator cuff tear or rupture of right shoulder, not specified as traumatic: Secondary | ICD-10-CM | POA: Diagnosis not present

## 2016-03-06 DIAGNOSIS — Z888 Allergy status to other drugs, medicaments and biological substances status: Secondary | ICD-10-CM | POA: Insufficient documentation

## 2016-03-06 DIAGNOSIS — Z833 Family history of diabetes mellitus: Secondary | ICD-10-CM | POA: Insufficient documentation

## 2016-03-06 DIAGNOSIS — Z6832 Body mass index (BMI) 32.0-32.9, adult: Secondary | ICD-10-CM | POA: Insufficient documentation

## 2016-03-06 DIAGNOSIS — Z7984 Long term (current) use of oral hypoglycemic drugs: Secondary | ICD-10-CM | POA: Insufficient documentation

## 2016-03-06 DIAGNOSIS — E669 Obesity, unspecified: Secondary | ICD-10-CM | POA: Insufficient documentation

## 2016-03-06 DIAGNOSIS — Z803 Family history of malignant neoplasm of breast: Secondary | ICD-10-CM | POA: Insufficient documentation

## 2016-03-06 DIAGNOSIS — I10 Essential (primary) hypertension: Secondary | ICD-10-CM | POA: Insufficient documentation

## 2016-03-06 DIAGNOSIS — M75111 Incomplete rotator cuff tear or rupture of right shoulder, not specified as traumatic: Secondary | ICD-10-CM | POA: Insufficient documentation

## 2016-03-06 DIAGNOSIS — E785 Hyperlipidemia, unspecified: Secondary | ICD-10-CM | POA: Insufficient documentation

## 2016-03-06 DIAGNOSIS — M19011 Primary osteoarthritis, right shoulder: Secondary | ICD-10-CM | POA: Insufficient documentation

## 2016-03-06 DIAGNOSIS — M65811 Other synovitis and tenosynovitis, right shoulder: Secondary | ICD-10-CM | POA: Insufficient documentation

## 2016-03-06 DIAGNOSIS — F329 Major depressive disorder, single episode, unspecified: Secondary | ICD-10-CM | POA: Insufficient documentation

## 2016-03-06 HISTORY — PX: SHOULDER ARTHROSCOPY WITH OPEN ROTATOR CUFF REPAIR AND DISTAL CLAVICLE ACROMINECTOMY: SHX5683

## 2016-03-06 LAB — HEMOGLOBIN A1C
Hgb A1c MFr Bld: 6.5 % — ABNORMAL HIGH (ref 4.8–5.6)
MEAN PLASMA GLUCOSE: 140 mg/dL

## 2016-03-06 LAB — GLUCOSE, CAPILLARY
GLUCOSE-CAPILLARY: 128 mg/dL — AB (ref 65–99)
GLUCOSE-CAPILLARY: 121 mg/dL — AB (ref 65–99)

## 2016-03-06 SURGERY — SHOULDER ARTHROSCOPY WITH OPEN ROTATOR CUFF REPAIR AND DISTAL CLAVICLE ACROMINECTOMY
Anesthesia: General | Site: Shoulder | Laterality: Right

## 2016-03-06 MED ORDER — MIDAZOLAM HCL 2 MG/2ML IJ SOLN
INTRAMUSCULAR | Status: AC
  Filled 2016-03-06: qty 2

## 2016-03-06 MED ORDER — ROCURONIUM BROMIDE 10 MG/ML (PF) SYRINGE
PREFILLED_SYRINGE | INTRAVENOUS | Status: AC
  Filled 2016-03-06: qty 10

## 2016-03-06 MED ORDER — DEXAMETHASONE SODIUM PHOSPHATE 10 MG/ML IJ SOLN
INTRAMUSCULAR | Status: DC | PRN
  Administered 2016-03-06: 10 mg via INTRAVENOUS

## 2016-03-06 MED ORDER — FENTANYL CITRATE (PF) 100 MCG/2ML IJ SOLN
100.0000 ug | Freq: Once | INTRAMUSCULAR | Status: AC
  Administered 2016-03-06: 100 ug via INTRAVENOUS

## 2016-03-06 MED ORDER — LACTATED RINGERS IV SOLN: INTRAVENOUS | Status: DC

## 2016-03-06 MED ORDER — SUGAMMADEX SODIUM 500 MG/5ML IV SOLN
INTRAVENOUS | Status: DC | PRN
  Administered 2016-03-06: 221.4 mg via INTRAVENOUS

## 2016-03-06 MED ORDER — BUPIVACAINE-EPINEPHRINE (PF) 0.5% -1:200000 IJ SOLN
INTRAMUSCULAR | Status: DC | PRN
  Administered 2016-03-06: 15 mL via PERINEURAL

## 2016-03-06 MED ORDER — FENTANYL CITRATE (PF) 100 MCG/2ML IJ SOLN
INTRAMUSCULAR | Status: DC | PRN
  Administered 2016-03-06: 50 ug via INTRAVENOUS

## 2016-03-06 MED ORDER — DEXAMETHASONE SODIUM PHOSPHATE 10 MG/ML IJ SOLN
INTRAMUSCULAR | Status: AC
  Filled 2016-03-06: qty 1

## 2016-03-06 MED ORDER — STERILE WATER FOR IRRIGATION IR SOLN
Status: DC | PRN
  Administered 2016-03-06: 1000 mL

## 2016-03-06 MED ORDER — HYDROMORPHONE HCL 1 MG/ML IJ SOLN: 0.2500 mg | INTRAMUSCULAR | Status: DC | PRN

## 2016-03-06 MED ORDER — ONDANSETRON HCL 4 MG/2ML IJ SOLN
INTRAMUSCULAR | Status: AC
  Filled 2016-03-06: qty 2

## 2016-03-06 MED ORDER — CHLORHEXIDINE GLUCONATE 4 % EX LIQD: 60.0000 mL | Freq: Once | CUTANEOUS | Status: DC

## 2016-03-06 MED ORDER — PROPOFOL 10 MG/ML IV BOLUS
INTRAVENOUS | Status: DC | PRN
  Administered 2016-03-06: 130 mg via INTRAVENOUS
  Administered 2016-03-06: 70 mg via INTRAVENOUS

## 2016-03-06 MED ORDER — FENTANYL CITRATE (PF) 100 MCG/2ML IJ SOLN
INTRAMUSCULAR | Status: AC
  Filled 2016-03-06: qty 2

## 2016-03-06 MED ORDER — ROCURONIUM BROMIDE 10 MG/ML (PF) SYRINGE
PREFILLED_SYRINGE | INTRAVENOUS | Status: DC | PRN
  Administered 2016-03-06: 10 mg via INTRAVENOUS
  Administered 2016-03-06 (×2): 20 mg via INTRAVENOUS
  Administered 2016-03-06: 50 mg via INTRAVENOUS

## 2016-03-06 MED ORDER — MIDAZOLAM HCL 2 MG/2ML IJ SOLN
1.0000 mg | Freq: Once | INTRAMUSCULAR | Status: AC
  Administered 2016-03-06: 1 mg via INTRAVENOUS

## 2016-03-06 MED ORDER — EPHEDRINE SULFATE-NACL 50-0.9 MG/10ML-% IV SOSY
PREFILLED_SYRINGE | INTRAVENOUS | Status: DC | PRN
  Administered 2016-03-06 (×3): 5 mg via INTRAVENOUS

## 2016-03-06 MED ORDER — PHENYLEPHRINE HCL 10 MG/ML IJ SOLN
INTRAMUSCULAR | Status: DC | PRN
  Administered 2016-03-06: 15 ug/min via INTRAVENOUS

## 2016-03-06 MED ORDER — MEPERIDINE HCL 25 MG/ML IJ SOLN: 6.2500 mg | INTRAMUSCULAR | Status: DC | PRN

## 2016-03-06 MED ORDER — SODIUM CHLORIDE 0.9 % IR SOLN
Status: DC | PRN
  Administered 2016-03-06 (×3): 3000 mL

## 2016-03-06 MED ORDER — GLYCOPYRROLATE 0.2 MG/ML IV SOSY
PREFILLED_SYRINGE | INTRAVENOUS | Status: AC
  Filled 2016-03-06: qty 9

## 2016-03-06 MED ORDER — FENTANYL CITRATE (PF) 100 MCG/2ML IJ SOLN
INTRAMUSCULAR | Status: AC
  Administered 2016-03-06: 100 ug via INTRAVENOUS
  Filled 2016-03-06: qty 2

## 2016-03-06 MED ORDER — MIDAZOLAM HCL 2 MG/2ML IJ SOLN
INTRAMUSCULAR | Status: AC
  Administered 2016-03-06: 1 mg via INTRAVENOUS
  Filled 2016-03-06: qty 2

## 2016-03-06 MED ORDER — MIDAZOLAM HCL 5 MG/5ML IJ SOLN
INTRAMUSCULAR | Status: DC | PRN
  Administered 2016-03-06: 2 mg via INTRAVENOUS

## 2016-03-06 MED ORDER — ONDANSETRON HCL 4 MG/2ML IJ SOLN
INTRAMUSCULAR | Status: DC | PRN
  Administered 2016-03-06: 4 mg via INTRAVENOUS

## 2016-03-06 MED ORDER — ACETAMINOPHEN 10 MG/ML IV SOLN
1000.0000 mg | Freq: Four times a day (QID) | INTRAVENOUS | Status: DC
  Administered 2016-03-06: 1000 mg via INTRAVENOUS
  Filled 2016-03-06: qty 100

## 2016-03-06 MED ORDER — SODIUM CHLORIDE 0.9 % IV SOLN: 75.0000 mL/h | INTRAVENOUS | Status: DC

## 2016-03-06 MED ORDER — CEFAZOLIN SODIUM-DEXTROSE 2-4 GM/100ML-% IV SOLN
2.0000 g | INTRAVENOUS | Status: AC
  Administered 2016-03-06: 2 g via INTRAVENOUS
  Filled 2016-03-06: qty 100

## 2016-03-06 MED ORDER — EPHEDRINE 5 MG/ML INJ
INTRAVENOUS | Status: AC
  Filled 2016-03-06: qty 10

## 2016-03-06 MED ORDER — SUGAMMADEX SODIUM 200 MG/2ML IV SOLN
INTRAVENOUS | Status: AC
  Filled 2016-03-06: qty 2

## 2016-03-06 MED ORDER — LIDOCAINE 2% (20 MG/ML) 5 ML SYRINGE
INTRAMUSCULAR | Status: AC
  Filled 2016-03-06: qty 10

## 2016-03-06 MED ORDER — PROPOFOL 10 MG/ML IV BOLUS
INTRAVENOUS | Status: AC
  Filled 2016-03-06: qty 20

## 2016-03-06 MED ORDER — 0.9 % SODIUM CHLORIDE (POUR BTL) OPTIME
TOPICAL | Status: DC | PRN
  Administered 2016-03-06: 1000 mL

## 2016-03-06 MED ORDER — LIDOCAINE 2% (20 MG/ML) 5 ML SYRINGE
INTRAMUSCULAR | Status: DC | PRN
  Administered 2016-03-06: 50 mg via INTRAVENOUS

## 2016-03-06 MED ORDER — PROMETHAZINE HCL 25 MG/ML IJ SOLN: 6.2500 mg | INTRAMUSCULAR | Status: DC | PRN

## 2016-03-06 MED ORDER — LACTATED RINGERS IV SOLN
INTRAVENOUS | Status: DC
  Administered 2016-03-06 (×2): via INTRAVENOUS

## 2016-03-06 SURGICAL SUPPLY — 66 items
AID PSTN UNV HD RSTRNT DISP (MISCELLANEOUS)
ANCH SUT KNTLS STRL SHLDR SYS (Anchor) ×1 IMPLANT
ANCHOR PEEK ALL THREAD (Anchor) ×1 IMPLANT
ANCHOR SUT QUATTRO KNTLS 4.5 (Anchor) ×1 IMPLANT
APL SKNCLS STERI-STRIP NONHPOA (GAUZE/BANDAGES/DRESSINGS) ×1
BENZOIN TINCTURE PRP APPL 2/3 (GAUZE/BANDAGES/DRESSINGS) ×2 IMPLANT
BLADE CUTTER GATOR 3.5 (BLADE) ×1 IMPLANT
BLADE GREAT WHITE 4.2 (BLADE) ×1 IMPLANT
BLADE SURG 11 STRL SS (BLADE) ×2 IMPLANT
BUR OVAL 6.0 (BURR) ×2 IMPLANT
CANNULA ACUFLEX KIT 5X76 (CANNULA) ×2 IMPLANT
CLSR STERI-STRIP ANTIMIC 1/2X4 (GAUZE/BANDAGES/DRESSINGS) ×1 IMPLANT
COVER SURGICAL LIGHT HANDLE (MISCELLANEOUS) ×2 IMPLANT
DRAPE SHOULDER BEACH CHAIR (DRAPES) ×2 IMPLANT
DRSG PAD ABDOMINAL 8X10 ST (GAUZE/BANDAGES/DRESSINGS) ×2 IMPLANT
DURAPREP 26ML APPLICATOR (WOUND CARE) ×2 IMPLANT
ELECT NDL TIP 2.8 STRL (NEEDLE) ×1 IMPLANT
ELECT NEEDLE TIP 2.8 STRL (NEEDLE) ×2 IMPLANT
ELECT REM PT RETURN 9FT ADLT (ELECTROSURGICAL) ×2
ELECTRODE REM PT RTRN 9FT ADLT (ELECTROSURGICAL) ×1 IMPLANT
GAUZE SPONGE 4X4 12PLY STRL (GAUZE/BANDAGES/DRESSINGS) ×2 IMPLANT
GLOVE BIOGEL PI IND STRL 8 (GLOVE) ×1 IMPLANT
GLOVE BIOGEL PI IND STRL 8.5 (GLOVE) ×1 IMPLANT
GLOVE BIOGEL PI INDICATOR 8 (GLOVE) ×1
GLOVE BIOGEL PI INDICATOR 8.5 (GLOVE) ×1
GLOVE ECLIPSE 8.0 STRL XLNG CF (GLOVE) ×2 IMPLANT
GLOVE SURG ORTHO 8.5 STRL (GLOVE) ×4 IMPLANT
GOWN STRL REUS W/ TWL LRG LVL3 (GOWN DISPOSABLE) ×2 IMPLANT
GOWN STRL REUS W/TWL 2XL LVL3 (GOWN DISPOSABLE) ×2 IMPLANT
GOWN STRL REUS W/TWL LRG LVL3 (GOWN DISPOSABLE) ×4
KIT ROOM TURNOVER OR (KITS) ×2 IMPLANT
MANIFOLD NEPTUNE II (INSTRUMENTS) ×2 IMPLANT
NDL SPNL 18GX3.5 QUINCKE PK (NEEDLE) ×1 IMPLANT
NDL SUT 6 .5 CRC .975X.05 MAYO (NEEDLE) IMPLANT
NEEDLE 22X1 1/2 (OR ONLY) (NEEDLE) IMPLANT
NEEDLE MAYO TAPER (NEEDLE)
NEEDLE SPNL 18GX3.5 QUINCKE PK (NEEDLE) ×2 IMPLANT
NS IRRIG 1000ML POUR BTL (IV SOLUTION) ×2 IMPLANT
PACK SHOULDER (CUSTOM PROCEDURE TRAY) ×2 IMPLANT
PAD ARMBOARD 7.5X6 YLW CONV (MISCELLANEOUS) ×4 IMPLANT
RESTRAINT HEAD UNIVERSAL NS (MISCELLANEOUS) IMPLANT
SET ARTHROSCOPY TUBING (MISCELLANEOUS) ×2
SET ARTHROSCOPY TUBING LN (MISCELLANEOUS) ×1 IMPLANT
SLING ARM IMMOBILIZER LRG (SOFTGOODS) ×1 IMPLANT
SLING ARM IMMOBILIZER MED (SOFTGOODS) IMPLANT
SPONGE GAUZE 4X4 12PLY STER LF (GAUZE/BANDAGES/DRESSINGS) ×1 IMPLANT
SPONGE LAP 4X18 X RAY DECT (DISPOSABLE) ×2 IMPLANT
STRIP CLOSURE SKIN 1/2X4 (GAUZE/BANDAGES/DRESSINGS) ×2 IMPLANT
SUCTION FRAZIER TIP 10 FR DISP (SUCTIONS) ×2 IMPLANT
SUT ETHIBOND 0 MO6 C/R (SUTURE) ×1 IMPLANT
SUT ETHIBOND 2 0 SH (SUTURE) IMPLANT
SUT ETHIBOND 2 OS 4 DA (SUTURE) ×2 IMPLANT
SUT ETHILON 4 0 PS 2 18 (SUTURE) ×1 IMPLANT
SUT MNCRL AB 3-0 PS2 18 (SUTURE) ×2 IMPLANT
SUT VIC AB 0 CT1 27 (SUTURE) ×2
SUT VIC AB 0 CT1 27XBRD ANBCTR (SUTURE) ×1 IMPLANT
SUT VIC AB 2-0 CT1 27 (SUTURE)
SUT VIC AB 2-0 CT1 TAPERPNT 27 (SUTURE) IMPLANT
SUT VICRYL 0 UR6 27IN ABS (SUTURE) ×2 IMPLANT
SYR CONTROL 10ML LL (SYRINGE) IMPLANT
TAPE CLOTH SURG 6X10 WHT LF (GAUZE/BANDAGES/DRESSINGS) ×1 IMPLANT
TOWEL OR 17X24 6PK STRL BLUE (TOWEL DISPOSABLE) ×2 IMPLANT
TOWEL OR 17X26 10 PK STRL BLUE (TOWEL DISPOSABLE) ×2 IMPLANT
TUBE CONNECTING 12X1/4 (SUCTIONS) ×4 IMPLANT
WAND HAND CNTRL MULTIVAC 90 (MISCELLANEOUS) ×2 IMPLANT
WATER STERILE IRR 1000ML POUR (IV SOLUTION) ×2 IMPLANT

## 2016-03-06 NOTE — Anesthesia Procedure Notes (Signed)
Procedure Name: Intubation Date/Time: 03/06/2016 2:09 PM Performed by: Annabelle HarmanSMITH, Starlett Pehrson A Pre-anesthesia Checklist: Patient identified, Emergency Drugs available, Suction available, Patient being monitored and Timeout performed Patient Re-evaluated:Patient Re-evaluated prior to inductionOxygen Delivery Method: Circle system utilized Preoxygenation: Pre-oxygenation with 100% oxygen Intubation Type: IV induction Ventilation: Mask ventilation without difficulty and Oral airway inserted - appropriate to patient size Laryngoscope Size: Mac and 4 Grade View: Grade I Tube type: Oral Tube size: 7.5 mm Number of attempts: 1 Airway Equipment and Method: Stylet Placement Confirmation: ETT inserted through vocal cords under direct vision,  positive ETCO2 and breath sounds checked- equal and bilateral Secured at: 22 cm Tube secured with: Tape Dental Injury: Teeth and Oropharynx as per pre-operative assessment

## 2016-03-06 NOTE — Anesthesia Procedure Notes (Signed)
Performed by: Shirly Bartosiewicz A       

## 2016-03-06 NOTE — Discharge Instructions (Signed)
Peripheral Nerve Blocks Your caregiver has placed a nerve block in one of your arms or legs to reduce pain and discomfort. The block lessens the amount of pain medicine you will need. Your caregiver will inject you in the arm or leg that was operated on. The injection is usually given away from the surgical site. The injection is a local anesthetic or a combination of local anesthetics. This injection provides numbing pain relief for up to 18 to 24 hours. There are few possible complications from this procedure. However, you should notify your caregiver if you have any problems. Be aware that you may lose feeling at and around the surgical area. If numbness happens, take proper measures to avoid injury. Do not stand up unassisted if you have a nerve block in your leg. Do not try to lift items if you have had a nerve block in your arm. Be careful when placing hot or cold items on a numb extremity. SEEK IMMEDIATE MEDICAL CARE IF:  You have redness, swelling, pain, or discharge at the injection site.  You develop dizziness or lightheadedness.  You have blurred vision.  There is a ringing or buzzing in your ears.  You have a metal taste in your mouth.  You develop numbness or tingling around your mouth.  You develop drowsiness.  You develop confusion. This information is not intended to replace advice given to you by your health care provider. Make sure you discuss any questions you have with your health care provider. Document Released: 07/01/2007 Document Revised: 06/16/2011 Document Reviewed: 11/28/2014 Elsevier Interactive Patient Education  2017 [employer]

## 2016-03-06 NOTE — Transfer of Care (Signed)
Immediate Anesthesia Transfer of Care Note  Patient: Kerry Reynolds  Procedure(s) Performed: Procedure(s) with comments: SHOULDER ARTHROSCOPY WITH SUBACROMIAL DECOMPRESSION, DISTAL CLAVICLE RESECTION, MINI-OPEN ROTATOR CUFF REPAIR, POSSIBLE DERMASPAN PATCH (Right) - Needs RNFA  Patient Location: PACU  Anesthesia Type:GA combined with regional for post-op pain  Level of Consciousness: awake, alert , oriented and patient cooperative  Airway & Oxygen Therapy: Patient Spontanous Breathing and Patient connected to nasal cannula oxygen  Post-op Assessment: Report given to RN and Post -op Vital signs reviewed and stable  Post vital signs: Reviewed and stable  Last Vitals:  Vitals:   03/06/16 1141  BP: 130/76  Pulse: 98  Resp: 20  Temp: 36.8 C    Last Pain:  Vitals:   03/06/16 1141  TempSrc: Oral      Patients Stated Pain Goal: 3 (03/06/16 1204)  Complications: No apparent anesthesia complications

## 2016-03-06 NOTE — Anesthesia Preprocedure Evaluation (Addendum)
Anesthesia Evaluation  Patient identified by MRN, date of birth, ID band Patient awake    Reviewed: Allergy & Precautions, NPO status , Patient's Chart, lab work & pertinent test results  History of Anesthesia Complications (+) PONV and history of anesthetic complications  Airway Mallampati: I  TM Distance: >3 FB Neck ROM: Full    Dental  (+) Teeth Intact, Dental Advisory Given   Pulmonary sleep apnea ,    breath sounds clear to auscultation       Cardiovascular hypertension, Pt. on medications  Rhythm:Regular Rate:Normal     Neuro/Psych PSYCHIATRIC DISORDERS Anxiety Depression    GI/Hepatic negative GI ROS, Neg liver ROS,   Endo/Other  diabetes, Type 2, Oral Hypoglycemic Agents  Renal/GU negative Renal ROS  negative genitourinary   Musculoskeletal negative musculoskeletal ROS (+)   Abdominal   Peds negative pediatric ROS (+)  Hematology negative hematology ROS (+)   Anesthesia Other Findings   Reproductive/Obstetrics negative OB ROS                            Lab Results  Component Value Date   WBC 5.4 03/05/2016   HGB 15.4 03/05/2016   HCT 44.5 03/05/2016   MCV 91.4 03/05/2016   PLT 195 03/05/2016   Lab Results  Component Value Date   CREATININE 0.96 03/05/2016   BUN 13 03/05/2016   NA 138 03/05/2016   K 4.0 03/05/2016   CL 103 03/05/2016   CO2 25 03/05/2016   No results found for: INR, PROTIME  12/2015 EKG: normal sinus rhythm.   Anesthesia Physical Anesthesia Plan  ASA: II  Anesthesia Plan: General   Post-op Pain Management: GA combined w/ Regional for post-op pain   Induction: Intravenous  Airway Management Planned: Oral ETT  Additional Equipment:   Intra-op Plan:   Post-operative Plan: Extubation in OR  Informed Consent: I have reviewed the patients History and Physical, chart, labs and discussed the procedure including the risks, benefits and  alternatives for the proposed anesthesia with the patient or authorized representative who has indicated his/her understanding and acceptance.   Dental advisory given  Plan Discussed with: CRNA  Anesthesia Plan Comments:         Anesthesia Quick Evaluation

## 2016-03-06 NOTE — Anesthesia Postprocedure Evaluation (Signed)
Anesthesia Post Note  Patient: Kerry RakersEdwin L Reynolds  Procedure(s) Performed: Procedure(s) (LRB): SHOULDER ARTHROSCOPY WITH SUBACROMIAL DECOMPRESSION, DISTAL CLAVICLE RESECTION, MINI-OPEN ROTATOR CUFF REPAIR, POSSIBLE DERMASPAN PATCH (Right)  Patient location during evaluation: PACU Anesthesia Type: General and Regional Level of consciousness: awake and alert Pain management: pain level controlled Vital Signs Assessment: post-procedure vital signs reviewed and stable Respiratory status: spontaneous breathing, nonlabored ventilation, respiratory function stable and patient connected to nasal cannula oxygen Cardiovascular status: blood pressure returned to baseline and stable Postop Assessment: no signs of nausea or vomiting Anesthetic complications: no    Last Vitals:  Vitals:   03/06/16 1630 03/06/16 1645  BP:    Pulse: (!) 101 (!) 101  Resp: 17 16  Temp:      Last Pain:  Vitals:   03/06/16 1141  TempSrc: Oral                 Kerry Reynolds

## 2016-03-06 NOTE — Op Note (Signed)
PATIENT ID:      Kerry Reynolds  MRN:     [id number] DOB/AGE:    11-10-1955 / 60 y.o.       OPERATIVE REPORT    DATE OF PROCEDURE:  03/06/2016       PREOPERATIVE DIAGNOSIS:   ROTATOR CUFF TEAR RIGHT SHOULDER WITH IMPINGEMENT,DJD A-C JOINT                                                       Estimated body mass index is 32.19 kg/m as calculated from the following:   Height as of this encounter: 6\' 1"  (1.854 m).   Weight as of this encounter: 244 lb (110.7 kg).     POSTOPERATIVE DIAGNOSIS:SAME                                                                  Estimated body mass index is 32.19 kg/m as calculated from the following:   Height as of this encounter: 6\' 1"  (1.854 m).   Weight as of this encounter: 244 lb (110.7 kg).     PROCEDURE:  Procedure(s): SHOULDER ARTHROSCOPY WITH SUBACROMIAL DECOMPRESSION, DISTAL CLAVICLE RESECTION, MINI-OPEN ROTATOR CUFF REPAIR RIGHT SHOULDER     SURGEON:  Norlene CampbellPeter Tazaria Dlugosz, MD          ANESTHESIA: regional and general     DRAINS: none :      TOURNIQUET TIME: * No tourniquets in log *    COMPLICATIONS:  None   CONDITION:  stable  PROCEDURE IN DETAIL: [id number]   Kerry Reynolds 03/06/2016, 3:48 PM

## 2016-03-06 NOTE — H&P (Signed)
The recent History & Physical has been reviewed. I have personally examined the patient today. There is no interval change to the documented History & Physical. The patient would like to proceed with the procedure.  Valeria Batmaneter W Larren Copes 03/06/2016,  1:29 PM

## 2016-03-06 NOTE — Anesthesia Procedure Notes (Signed)
Anesthesia Regional Block:  Interscalene brachial plexus block  Pre-Anesthetic Checklist: ,, timeout performed, Correct Patient, Correct Site, Correct Laterality, Correct Procedure, Correct Position, site marked, Risks and benefits discussed,  Surgical consent,  Pre-op evaluation,  At surgeon's request and post-op pain management  Laterality: Right  Prep: chloraprep       Needles:  Injection technique: Single-shot  Needle Type: Echogenic Needle     Needle Length: 9cm 9 cm Needle Gauge: 21 and 21 G    Additional Needles:  Procedures: ultrasound guided (picture in chart) Interscalene brachial plexus block Narrative:  Start time: 03/06/2016 12:45 PM End time: 03/06/2016 12:54 PM Injection made incrementally with aspirations every 5 mL.  Performed by: Personally  Anesthesiologist: Shona SimpsonHOLLIS, KEVIN D  Additional Notes: No immediate complications. Brief paresthesia, needle adjusted, paresthesia gone, medicine injected.

## 2016-03-07 ENCOUNTER — Encounter (HOSPITAL_COMMUNITY): Payer: Self-pay | Admitting: Orthopaedic Surgery

## 2016-03-07 ENCOUNTER — Telehealth (INDEPENDENT_AMBULATORY_CARE_PROVIDER_SITE_OTHER): Payer: Self-pay | Admitting: Orthopaedic Surgery

## 2016-03-07 NOTE — Telephone Encounter (Signed)
Tried to call number provided, did not ring. Tried to call mobile number and would not let me leave a VM

## 2016-03-07 NOTE — Op Note (Signed)
NAMEMarland Kitchen  YEDIDYA, DUDDY NO.:  [id number]  MEDICAL RECORD NO.:  [id number]  LOCATION:  PERIO                        FACILITY:  [facility]  PHYSICIAN:  Vonna Kotyk. Seth Higginbotham, M.D.DATE OF BIRTH:  [birth year]  DATE OF PROCEDURE:  03/06/2016 DATE OF DISCHARGE:                              OPERATIVE REPORT   PREOPERATIVE DIAGNOSES: 1. Rotator cuff tear, right shoulder with impingement. 2. Degenerative joint disease acromioclavicular joint.  POSTOPERATIVE DIAGNOSES: 1. Rotator cuff tear, right shoulder with impingement. 2. Degenerative joint disease acromioclavicular joint.  PROCEDURE: 1. Diagnostic arthroscopy right shoulder with debridement of synovitis     and partial biceps tendon tear. 2. Arthroscopic subacromial decompression. 3. Arthroscopic distal clavicle resection. 4. Mini open rotator cuff tear repair.  SURGEON:  Vonna Kotyk. Durward Fortes, M.D.  ANESTHESIA:  General with supplemental interscalene nerve block.  COMPLICATIONS:  None.  HISTORY:  A 60 year old gentleman who sustained a fall 3 weeks ago injuring his right shoulder.  He has been unable to elevate his arm over his head since that time.  MRI scan reveals a tear of the infra and supraspinatus with 2-3 cm of retraction.  He also has a bulky AC joint. He is now to have an arthroscopic evaluation of rotator cuff tear repair.  DESCRIPTION OF PROCEDURE:  Mr. Sakata was met with his wife in the holding area and identified the right shoulder as appropriate operative site and marked it accordingly.  He had a preoperative interscalene nerve block per Anesthesia.  The patient was then transported to room #9, placed under general anesthesia without difficulty.  He was then placed in a semi-sitting position with a shoulder frame.  Time-out was called.  Right shoulder was then prepped with chlorhexidine scrub and DuraPrep from the base of the neck circumferentially to the wrist and sterile draping was performed.   Time-out was called again.  Marking pen was used to outline the [facility] joint, the coracoid and acromion.  At a point of fingerbreadth posterior and medial to the posterior angle acromion, a small stab wound was made and the arthroscope easily placed into the shoulder joint.  Diagnostic arthroscopy revealed diffuse synovitis.  A second portal was established anteriorly with a ribbed cannula and the synovitis was removed.  I saw a partial tear of the subscapularis tendon and few areas of tearing of the biceps tendon, but otherwise it appeared to be intact and the tears were less than 20% of the width.  Therefore, I left it in place.  There was an obvious rotator cuff tear and I could easily visualize the subacromial space.  I did not see any loose bodies or significant chondromalacia of the glenohumeral joint.  The arthroscope was then placed in the subacromial space posteriorly, the cannula in the subacromial space anteriorly, and a third portal established in lateral subacromial space.  An arthroscopic subacromial decompression distal clavicle resection was performed with a 6 mm bur.  The arthroscopic portals were then irrigated with saline solution.  A mini open rotator cuff tear repair was then performed.  About an inch and a half incision was made over the anterior aspect of the shoulder and via sharp dissection carried down to  subcutaneous tissue.  Gross bleeders were Bovie coagulated.  A rhaphe in the deltoid fascia was identified and incised.  Subacromial space was entered.  The rotator cuff tear involved both infra and supraspinatus with some retraction.  I was able to re-establish what I felt was normal anatomic alignment.  I debrided the anterior aspect of the humeral head and then repaired both infra and supraspinatus tendons to their anatomic position using a series of 0 Ethibond suture, a single PEEK anchor with a second row repair using the Quattro anchor.  So I had a very  nice repair without any retraction with motion and no evidence of impingement.  The wound was irrigated with saline solution.  The deltoid fascia closed with a running 0 Vicryl, the subcu with 2-0 Vicryl, and the skin closed with Steri-Strips over benzoin.  Sterile bulky dressing was applied followed by a sling.  PLAN:  Oxycodone for pain.  Office in 1 week.     Vonna Kotyk. Durward Fortes, M.D.     PWW/MEDQ  D:  03/06/2016  T:  03/07/2016  Job:  [id number]

## 2016-03-07 NOTE — Telephone Encounter (Signed)
Patient's wife called about the patient's pain level. The pain medication is not helping

## 2016-03-10 ENCOUNTER — Encounter (INDEPENDENT_AMBULATORY_CARE_PROVIDER_SITE_OTHER): Payer: Self-pay | Admitting: Orthopaedic Surgery

## 2016-03-10 ENCOUNTER — Ambulatory Visit (INDEPENDENT_AMBULATORY_CARE_PROVIDER_SITE_OTHER): Payer: 59 | Admitting: Orthopaedic Surgery

## 2016-03-10 VITALS — BP 106/82 | HR 100 | Resp 14 | Ht 72.0 in | Wt 245.0 lb

## 2016-03-10 DIAGNOSIS — G8929 Other chronic pain: Secondary | ICD-10-CM

## 2016-03-10 DIAGNOSIS — M25511 Pain in right shoulder: Secondary | ICD-10-CM

## 2016-03-10 MED ORDER — OXYCODONE HCL 5 MG PO CAPS: 5.0000 mg | ORAL_CAPSULE | ORAL | 0 refills | Status: DC | PRN

## 2016-03-10 NOTE — Telephone Encounter (Signed)
done

## 2016-03-10 NOTE — Progress Notes (Signed)
   Office Visit Note   Patient: Kerry Reynolds           Date of Birth: Aug 18, 1955           MRN: [id number] Visit Date: 03/10/2016              Requested by: Elias Elseobert Reade, MD [phone] [address] [place], [place] [address] PCP: Lolita PatellaEADE,ROBERT ALEXANDER, MD   Assessment & Plan: Visit Diagnoses: No diagnosis found. Mr. Kerry Reynolds had a large rotator cuff tear that I repaired and and and supplemented that with an anchor. I thought he had a nice repair. I will plan to keep him in the sling and begin circumduction exercises will plan on scheduling therapy when he returns next week. I will give him a prescription for oxy IR 5 mg  Plan: f//u 1 week  Follow-Up Instructions: No Follow-up on file.   Orders:  No orders of the defined types were placed in this encounter.  No orders of the defined types were placed in this encounter.     Procedures: No procedures performed   Clinical Data: No additional findings.   Subjective: No chief complaint on file.   Pt is 4 days post op of Right shoulder surgery  Pt having trouble sleeping in bed at night, cannot get comfortable.    Review of Systems   Objective: Vital Signs: There were no vitals taken for this visit.  Physical Exam  Ortho Exam right shoulder exam reveals the wounds to be healing nicely stitches removed and Steri-Strips were replaced over benzoin. There was no evidence of infection. Neurovascular exam is intact.  Specialty Comments:  No specialty comments available.  Imaging: No results found.   PMFS History: Patient Active Problem List   Diagnosis Date Noted  . Obesity 07/18/2015  . OSA (obstructive sleep apnea) 06/07/2013   Past Medical History:  Diagnosis Date  . Anxiety   . Depression   . Diabetes (HCC)    type II  . Heart murmur   . HTN (hypertension)   . Hyperlipidemia   . PONV (postoperative nausea and vomiting)   . Sleep apnea   . Tinnitus     Family History  Problem Relation Age of  Onset  . Breast cancer Mother   . Diabetes Maternal Grandmother     Past Surgical History:  Procedure Laterality Date  . BACK SURGERY     lower back  . COLONOSCOPY    . HERNIA REPAIR     [employer]  . LACERATION REPAIR Right    arm  . SHOULDER ARTHROSCOPY WITH OPEN ROTATOR CUFF REPAIR AND DISTAL CLAVICLE ACROMINECTOMY Right 03/06/2016   Procedure: SHOULDER ARTHROSCOPY WITH SUBACROMIAL DECOMPRESSION, DISTAL CLAVICLE RESECTION, MINI-OPEN ROTATOR CUFF REPAIR, POSSIBLE [facility];  Surgeon: Valeria BatmanPeter W Whitfield, MD;  Location: [facility] OR;  Service: Orthopedics;  Laterality: Right;  Needs RNFA  . WISDOM TOOTH EXTRACTION     Social History   Occupational History  . teacher    Social History Main Topics  . Smoking status: Never Smoker  . Smokeless tobacco: Never Used  . Alcohol use No  . Drug use: No  . Sexual activity: No

## 2016-03-10 NOTE — Telephone Encounter (Signed)
Per our discussion-double dilaudid

## 2016-03-17 ENCOUNTER — Encounter (INDEPENDENT_AMBULATORY_CARE_PROVIDER_SITE_OTHER): Payer: Self-pay | Admitting: Orthopaedic Surgery

## 2016-03-17 ENCOUNTER — Ambulatory Visit (INDEPENDENT_AMBULATORY_CARE_PROVIDER_SITE_OTHER): Payer: 59 | Admitting: Orthopaedic Surgery

## 2016-03-17 VITALS — BP 120/80 | HR 70 | Resp 14 | Ht 73.0 in | Wt 250.0 lb

## 2016-03-17 DIAGNOSIS — M25511 Pain in right shoulder: Secondary | ICD-10-CM

## 2016-03-17 DIAGNOSIS — M75121 Complete rotator cuff tear or rupture of right shoulder, not specified as traumatic: Secondary | ICD-10-CM

## 2016-03-17 NOTE — Progress Notes (Signed)
   Office Visit Note   Patient: Kerry Reynolds           Date of Birth: [birth year]           MRN: [id number] Visit Date: 03/17/2016              Requested by: Elias Elseobert Reade, MD [phone] [address] [place], [place] [address] PCP: Lolita PatellaEADE,ROBERT ALEXANDER, MD   Assessment & Plan: Visit Diagnoses: 11 days status post rotator cuff tear repair right shoulder with SAD and DCR. Kerry Reynolds is doing quite well wearing his sling. He still taking some pain medicine.  Plan: Follow-up in 2-3 weeks. We [facility] [address] for passive range of motion  Follow-Up Instructions: No Follow-up on file.   Orders:  No orders of the defined types were placed in this encounter.  No orders of the defined types were placed in this encounter.     Procedures: No procedures performed   Clinical Data: No additional findings.   Subjective: No chief complaint on file.   Pt is 11 days post op of Right rotator repair  Pt's pain is good but when he moves around pain follows.  Oxycodone 1 tab every 4 hrs.    Review of Systems   Objective: Vital Signs: There were no vitals taken for this visit.  Physical Exam  Ortho Exam right shoulder exam reveals the wounds to be healing nicely without evidence of infection is some swelling of his arm probably because of the flexed position at the elbow no swelling of his hand. Neurovascular exam is intact. Good grip and release. I did not check range of motion  Specialty Comments:  No specialty comments available.  Imaging: No results found.   PMFS History: Patient Active Problem List   Diagnosis Date Noted  . Obesity 07/18/2015  . OSA (obstructive sleep apnea) 06/07/2013   Past Medical History:  Diagnosis Date  . Anxiety   . Depression   . Diabetes (HCC)    type II  . Heart murmur   . HTN (hypertension)   . Hyperlipidemia   . PONV (postoperative nausea and vomiting)   . Sleep  apnea   . Tinnitus     Family History  Problem Relation Age of Onset  . Breast cancer Mother   . Diabetes Maternal Grandmother     Past Surgical History:  Procedure Laterality Date  . BACK SURGERY     lower back  . COLONOSCOPY    . [facility]ERNIA REPAIR     [employer]  . LACERATION REPAIR Right    arm  . SHOULDER ARTHROSCOPY WITH OPEN ROTATOR CUFF REPAIR AND DISTAL CLAVICLE ACROMINECTOMY Right 03/06/2016   Procedure: SHOULDER ARTHROSCOPY WITH SUBACROMIAL DECOMPRESSION, DISTAL CLAVICLE RESECTION, MINI-OPEN ROTATOR CUFF REPAIR, POSSIBLE [facility] PATCH;  Surgeon: Valeria BatmanPeter W Genola Yuille, MD;  Location: [facility] OR;  Service: Orthopedics;  Laterality: Right;  Needs RNFA  . WISDOM TOOTH EXTRACTION     Social History   Occupational History  . teacher    Social History Main Topics  . Smoking status: Never Smoker  . Smokeless tobacco: Never Used  . Alcohol use No  . Drug use: No  . Sexual activity: No

## 2016-03-17 NOTE — Addendum Note (Signed)
Addended by: Audrie LiaAUDLE, SHARON H on: 03/17/2016 04:14 PM   Modules accepted: Orders

## 2016-03-25 ENCOUNTER — Telehealth (INDEPENDENT_AMBULATORY_CARE_PROVIDER_SITE_OTHER): Payer: Self-pay | Admitting: Orthopaedic Surgery

## 2016-03-25 NOTE — Telephone Encounter (Signed)
Patient called and says he has not heard back about physical therapy and was calling to follow up.

## 2016-04-04 ENCOUNTER — Encounter (INDEPENDENT_AMBULATORY_CARE_PROVIDER_SITE_OTHER): Payer: Self-pay | Admitting: Orthopaedic Surgery

## 2016-04-04 ENCOUNTER — Ambulatory Visit (INDEPENDENT_AMBULATORY_CARE_PROVIDER_SITE_OTHER): Payer: 59 | Admitting: Orthopaedic Surgery

## 2016-04-04 VITALS — BP 115/82 | HR 95 | Ht 73.0 in | Wt 245.0 lb

## 2016-04-04 DIAGNOSIS — M25512 Pain in left shoulder: Secondary | ICD-10-CM

## 2016-04-04 DIAGNOSIS — G8929 Other chronic pain: Secondary | ICD-10-CM

## 2016-04-04 NOTE — Progress Notes (Signed)
   Office Visit Note   Patient: Kerry Reynolds           Date of Birth: [birth year]           MRN: [id number] Visit Date: 04/04/2016              Requested by: Kerry Elseobert Reade, Reynolds [phone] Kerry Reynolds [address] [place], [place] [address] PCP: Kerry Reynolds   Assessment & Plan: Visit Diagnoses: Nearly 1 month status post rotator cuff tear repair right shoulder doing well.   Plan: Kerry Reynolds is scheduled to have physical therapy at [facility] on the fourth. I have instructed him on range of motion exercises passively. I'd like him to wear the sling when he is out and about for at least the next 2 weeks. 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 As long as he is confident behind the wheel he can drive. Follow-up in one month.  Follow-Up Instructions: No Follow-up on file.   Orders:  No orders of the defined types were placed in this encounter.  No orders of the defined types were placed in this encounter.     Procedures: No procedures performed   Clinical Data: No additional findings.   Subjective: No chief complaint on file.   Pt is 4 weeks post op, pain is manageable Pt wants to drive  Review of Systems  has not started physical therapy as yet but has an appointment to start next Thursday the fourth   Objective: Vital Signs: There were no vitals taken for this visit.  Physical Exam  Ortho Exam right shoulder exam reveals the incisions to be healed nicely without evidence of infection. Passive abduction to 90 and flexion the same. Neurovascular exam is intact.   Specialty Comments:  No specialty comments available.  Imaging: No results found.   PMFS History: Patient Active Problem List   Diagnosis Date Noted  . Obesity 07/18/2015  . OSA (obstructive sleep apnea) 06/07/2013   Past Medical History:  Diagnosis Date  . Anxiety   . Depression   . Diabetes (HCC)    type II  . Heart murmur   . HTN (hypertension)   .  Hyperlipidemia   . PONV (postoperative nausea and vomiting)   . Sleep apnea   . Tinnitus     Family History  Problem Relation Age of Onset  . Breast cancer Mother   . Diabetes Maternal Grandmother     Past Surgical History:  Procedure Laterality Date  . BACK SURGERY     lower back  . COLONOSCOPY    . HERNIA REPAIR     [employer]  . LACERATION REPAIR Right    arm  . SHOULDER ARTHROSCOPY WITH OPEN ROTATOR CUFF REPAIR AND DISTAL CLAVICLE ACROMINECTOMY Right 03/06/2016   Procedure: SHOULDER ARTHROSCOPY WITH SUBACROMIAL DECOMPRESSION, DISTAL CLAVICLE RESECTION, MINI-OPEN ROTATOR CUFF REPAIR, POSSIBLE [facility];  Surgeon: Kerry BatmanPeter W Buford Bremer, Reynolds;  Location: [facility];  Service: Orthopedics;  Laterality: Right;  Needs RNFA  . WISDOM TOOTH EXTRACTION     Social History   Occupational History  . teacher    Social History Main Topics  . Smoking status: Never Smoker  . Smokeless tobacco: Never Used  . Alcohol use No  . Drug use: No  . Sexual activity: No

## 2016-04-09 ENCOUNTER — Ambulatory Visit: Payer: Worker's Compensation | Admitting: Physical Therapy

## 2016-04-10 ENCOUNTER — Ambulatory Visit: Payer: Worker's Compensation | Attending: Orthopaedic Surgery | Admitting: Physical Therapy

## 2016-04-10 DIAGNOSIS — M25511 Pain in right shoulder: Secondary | ICD-10-CM | POA: Insufficient documentation

## 2016-04-10 DIAGNOSIS — M6281 Muscle weakness (generalized): Secondary | ICD-10-CM | POA: Diagnosis not present

## 2016-04-10 DIAGNOSIS — M25611 Stiffness of right shoulder, not elsewhere classified: Secondary | ICD-10-CM | POA: Diagnosis not present

## 2016-04-10 NOTE — Therapy (Signed)
[facility] [address] [place], [place], [address] Phone: [phone]   Fax:  [phone]  Physical Therapy Evaluation  Patient Details  Name: Kerry Reynolds MRN: [id number] Date of Birth: Oct 05, 1955 Referring Provider: Dr. Cleophas Dunker  Encounter Date: 04/10/2016      PT End of Session - 04/10/16 1713    Visit Number 1   Number of Visits 23   Date for PT Re-Evaluation 06/05/16   Authorization Type [employer] 23 visit limit   PT Start Time 1615   PT Stop Time 1705   PT Time Calculation (min) 50 min   Activity Tolerance Patient limited by pain      Past Medical History:  Diagnosis Date  . Anxiety   . Depression   . Diabetes (HCC)    type II  . Heart murmur   . HTN (hypertension)   . Hyperlipidemia   . PONV (postoperative nausea and vomiting)   . Sleep apnea   . Tinnitus     Past Surgical History:  Procedure Laterality Date  . BACK SURGERY     lower back  . COLONOSCOPY    . HERNIA REPAIR     [employer]  . LACERATION REPAIR Right    arm  . SHOULDER ARTHROSCOPY WITH OPEN ROTATOR CUFF REPAIR AND DISTAL CLAVICLE ACROMINECTOMY Right 03/06/2016   Procedure: SHOULDER ARTHROSCOPY WITH SUBACROMIAL DECOMPRESSION, DISTAL CLAVICLE RESECTION, MINI-OPEN ROTATOR CUFF REPAIR, POSSIBLE [facility] PATCH;  Surgeon: Valeria Batman, MD;  Location: [facility] OR;  Service: Orthopedics;  Laterality: Right;  Needs RNFA  . WISDOM TOOTH EXTRACTION      There were no vitals filed for this visit.       Subjective Assessment - 04/10/16 1622    Subjective Fell on 02/13/16 injuring right shoulder resulting in right rotator cuff tear.  Mini open rotator cuff repair 11/30.  Good days and bad days.  Not sleeping well at night.   Arrives without sling, states he was told to wear at work.     Limitations House hold activities   Patient Stated Goals I need to be more active (get back on treadmill);  sleep all night long   Currently in Pain? Yes   Pain Score 7    Pain Location Shoulder   Pain Orientation Right   Pain Type Surgical pain   Pain Radiating Towards radiates down to hand   Pain Onset More than a month ago   Pain Frequency Intermittent   Aggravating Factors  try to lie on that shoulder;  work on computer;  move around   Pain Relieving Factors be still, inactive;              [facility] PT Assessment - 04/10/16 0001      Assessment   Medical Diagnosis right rotator cuff repair   Referring Provider Dr. Cleophas Dunker   Onset Date/Surgical Date 03/06/16   Hand Dominance Right   Next MD Visit 1 month   Prior Therapy many years ago     Precautions   Precautions Shoulder   Type of Shoulder Precautions rotator cuff precautions     Restrictions   Weight Bearing Restrictions No     Balance Screen   Has the patient fallen in the past 6 months Yes   How many times? 1   Has the patient had a decrease in activity level because of a fear of falling?  No   Is the patient reluctant to leave their home because of a  fear of falling?  No     Home Tourist information centre manager residence     Prior Function   Level of Independence Independent   Vocation Full time employment   Buyer, retail   Leisure music bass guitar;  watch old movies;     Observation/Other Assessments   Focus on Therapeutic Outcomes (FOTO)  56% limitation     Posture/Postural Control   Posture/Postural Control Postural limitations   Posture Comments rounded shoulders;     mild swelling knuckles     AROM   Right/Left Shoulder --  right not formally assessed secondary to precautions    Left Shoulder Flexion 170 Degrees   Left Shoulder ABduction 160 Degrees     PROM   Right/Left Shoulder --  all motions very painful   Right Shoulder Extension 0 Degrees   Right Shoulder Flexion 45 Degrees   Right Shoulder ABduction 30 Degrees   Right Shoulder Internal Rotation 30 Degrees   Right Shoulder External Rotation 25 Degrees      Strength   Strength Assessment Site --  right not formally tested secondary to surgical precautions   Left Shoulder Flexion 5/5   Left Shoulder Extension 5/5   Left Shoulder ABduction 5/5                   [facility] Adult PT Treatment/Exercise - 04/10/16 0001      Shoulder Exercises: Supine   Other Supine Exercises PROM small range flex, abd and ER 10x each     Shoulder Exercises: Seated   Other Seated Exercises UE ranger on floor side to side, forward and back 20x each                  PT Short Term Goals - 04/10/16 2114      PT SHORT TERM GOAL #1   Title The patient will initiate knowledge of self care strategies to promote surgical healing (precautionary motion, positioning)   05/08/16   Time 4   Period Weeks   Status New     PT SHORT TERM GOAL #2   Title The patient will report a 30% improvement in sleep   Time 4   Period Weeks   Status New     PT SHORT TERM GOAL #3   Title Passive and active assisted  flexion/elevation right shoulder  to 140 degrees as needed to progress to active ROM   Time 4   Period Weeks   Status New           PT Long Term Goals - 04/10/16 2120      PT LONG TERM GOAL #1   Title The patient will be independent in safe self progression of HEP needed for further improvements in ROM and strength   06/05/16   Time 8   Period Weeks   Status New     PT LONG TERM GOAL #2   Title The patient will report a 60% improvement in sleep and pain with usual home /work ADLs    Time 8   Period Weeks   Status New     PT LONG TERM GOAL #3   Title Right shoulder flexion/elevation to 120 degrees, external rotation to 50 degrees, internal rotation to L4 region behind back needed for grooming/dressing using right UE   Time 8   Period Weeks   Status New     PT LONG TERM GOAL #4   Title Right shoulder strength grossly 3+/5  to 4-/5  needed for light lifting    Time 8   Period Weeks   Status New     PT LONG TERM GOAL #5   Title FOTO  functional outcome score improved from 56% limitation to 35% indicating improved function with less pain   Time 8   Period Weeks   Status New               Plan - 04/10/16 2051    Clinical Impression Statement The patient had a fall on 02/13/16 resulting in a complete right rotator cuff tear.  He underwent mini-open repair with subacromial decompression and distal clavicle resection on 03/06/16.  He is now 5 weeks post op.  He presents without a sling and reports he hasn't worn it in 2 weeks and is unclear of sling usage.  He complains of moderate to severe shoulder pain especially at night time which wakes him up.  He reports he was instructed in pendulum exercises but he does not do them often because they are too painful.  Very painful with attempted passive ROM flexion 45, abduction 30 degrees.  Active ROM and strength testing not performed secondary to surgical precautions following complete rotator cuff repair.  The patient is right hand dominant and can only perform self care/grooming with his left hand only.  He has returned to work as a Runner, broadcasting/film/videoteacher.  The patient is of low complexity evaluation secondary to lack of co-morbidities and stable status.     Rehab Potential Good   Clinical Impairments Affecting Rehab Potential rotator cuff repair for complete tear 03/06/16   PT Frequency 2x / week   PT Duration 8 weeks   PT Treatment/Interventions ADLs/Self Care Home Management;Cryotherapy;Electrical Stimulation;Moist Heat;Therapeutic exercise;Patient/family education;Passive range of motion;Manual techniques;Taping;Vasopneumatic Device   PT Next Visit Plan Passive ROM right shoulder;  e-stim for pain control;  biceps; scapular retractions;  rotator cuff repair for complete tear       Patient will benefit from skilled therapeutic intervention in order to improve the following deficits and impairments:  Decreased range of motion, Decreased strength, Impaired perceived functional ability,  Impaired UE functional use, Pain, Increased edema  Visit Diagnosis: Acute pain of right shoulder - Plan: PT plan of care cert/re-cert  Stiffness of right shoulder, not elsewhere classified - Plan: PT plan of care cert/re-cert  Muscle weakness (generalized) - Plan: PT plan of care cert/re-cert     Problem List Patient Active Problem List   Diagnosis Date Noted  . Obesity 07/18/2015  . OSA (obstructive sleep apnea) 06/07/2013    Vivien PrestoSimpson, Kalynn Declercq C 04/10/2016, 9:29 PM  [facility] [address] [place], [place], [address] Phone: [phone]   Fax:  [phone]  Name: Regan Rakersdwin L Bielak MRN: [id number] Date of Birth: Sep 16, 1955

## 2016-04-10 NOTE — Therapy (Signed)
[facility] [address] [place], [place], [address] Phone: [phone]   Fax:  [phone]  Physical Therapy Evaluation  Patient Details  Name: Kerry Reynolds MRN: [id number] Date of Birth: 01-31-1956 Referring Provider: Dr. Cleophas Dunker  Encounter Date: 04/10/2016      PT End of Session - 04/10/16 1713    Visit Number 1   Number of Visits 23   Date for PT Re-Evaluation 06/05/16   Authorization Type [employer] 23 visit limit   PT Start Time 1615   PT Stop Time 1705   PT Time Calculation (min) 50 min   Activity Tolerance Patient limited by pain      Past Medical History:  Diagnosis Date  . Anxiety   . Depression   . Diabetes (HCC)    type II  . Heart murmur   . HTN (hypertension)   . Hyperlipidemia   . PONV (postoperative nausea and vomiting)   . Sleep apnea   . Tinnitus     Past Surgical History:  Procedure Laterality Date  . BACK SURGERY     lower back  . COLONOSCOPY    . HERNIA REPAIR     [employer]  . LACERATION REPAIR Right    arm  . SHOULDER ARTHROSCOPY WITH OPEN ROTATOR CUFF REPAIR AND DISTAL CLAVICLE ACROMINECTOMY Right 03/06/2016   Procedure: SHOULDER ARTHROSCOPY WITH SUBACROMIAL DECOMPRESSION, DISTAL CLAVICLE RESECTION, MINI-OPEN ROTATOR CUFF REPAIR, POSSIBLE [place] PATCH;  Surgeon: Valeria Batman, MD;  Location: [facility] OR;  Service: Orthopedics;  Laterality: Right;  Needs RNFA  . WISDOM TOOTH EXTRACTION      There were no vitals filed for this visit.       Subjective Assessment - 04/10/16 1622    Subjective Fell on 02/13/16 injuring right shoulder resulting in right rotator cuff tear.  Mini open rotator cuff repair 11/30.  Good days and bad days.  Not sleeping well at night.   Arrives without sling, states he was told to wear at work.     Limitations House hold activities   Patient Stated Goals I need to be more active (get back on treadmill);  sleep all night long   Currently in Pain? Yes   Pain Score 7    Pain Location Shoulder   Pain Orientation Right   Pain Type Surgical pain   Pain Radiating Towards radiates down to hand   Pain Onset More than a month ago   Pain Frequency Intermittent   Aggravating Factors  try to lie on that shoulder;  work on computer;  move around   Pain Relieving Factors be still, inactive;              [facility] PT Assessment - 04/10/16 0001      Assessment   Medical Diagnosis right rotator cuff repair   Referring Provider Dr. Cleophas Dunker   Onset Date/Surgical Date 03/06/16   Hand Dominance Right   Next MD Visit 1 month   Prior Therapy many years ago     Precautions   Precautions Shoulder   Type of Shoulder Precautions rotator cuff precautions     Restrictions   Weight Bearing Restrictions No     Balance Screen   Has the patient fallen in the past 6 months Yes   How many times? 1   Has the patient had a decrease in activity level because of a fear of falling?  No   Is the patient reluctant to leave their home because of a  fear of falling?  No     Home Tourist information centre manager residence     Prior Function   Level of Independence Independent   Vocation Full time employment   Buyer, retail   Leisure music bass guitar;  watch old movies;     Observation/Other Assessments   Focus on Therapeutic Outcomes (FOTO)  56% limitation     Posture/Postural Control   Posture/Postural Control Postural limitations   Posture Comments rounded shoulders;     mild swelling knuckles     AROM   Right/Left Shoulder --  right not formally assessed secondary to precautions    Left Shoulder Flexion 170 Degrees   Left Shoulder ABduction 160 Degrees     PROM   Right/Left Shoulder --  all motions very painful   Right Shoulder Extension 0 Degrees   Right Shoulder Flexion 45 Degrees   Right Shoulder ABduction 30 Degrees   Right Shoulder Internal Rotation 30 Degrees   Right Shoulder External Rotation 25 Degrees      Strength   Strength Assessment Site --  right not formally tested secondary to surgical precautions   Left Shoulder Flexion 5/5   Left Shoulder Extension 5/5   Left Shoulder ABduction 5/5                   [facility] Adult PT Treatment/Exercise - 04/10/16 0001      Shoulder Exercises: Supine   Other Supine Exercises PROM small range flex, abd and ER 10x each     Shoulder Exercises: Seated   Other Seated Exercises UE ranger on floor side to side, forward and back 20x each                  PT Short Term Goals - 04/10/16 2114      PT SHORT TERM GOAL #1   Title The patient will initiate knowledge of self care strategies to promote surgical healing (precautionary motion, positioning)   05/08/16   Time 4   Period Weeks   Status New     PT SHORT TERM GOAL #2   Title The patient will report a 30% improvement in sleep   Time 4   Period Weeks   Status New     PT SHORT TERM GOAL #3   Title Passive and active assisted  flexion/elevation right shoulder  to 140 degrees as needed to progress to active ROM   Time 4   Period Weeks   Status New           PT Long Term Goals - 04/10/16 2120      PT LONG TERM GOAL #1   Title The patient will be independent in safe self progression of HEP needed for further improvements in ROM and strength   06/05/16   Time 8   Period Weeks   Status New     PT LONG TERM GOAL #2   Title The patient will report a 60% improvement in sleep and pain with usual home /work ADLs    Time 8   Period Weeks   Status New     PT LONG TERM GOAL #3   Title Right shoulder flexion/elevation to 120 degrees, external rotation to 50 degrees, internal rotation to L4 region behind back needed for grooming/dressing using right UE   Time 8   Period Weeks   Status New     PT LONG TERM GOAL #4   Title Right shoulder strength grossly 3+/5  to 4-/5  needed for light lifting and return to playing bass guitar   Time 8   Period Weeks   Status New     PT  LONG TERM GOAL #5   Title FOTO functional outcome score improved from 56% limitation to 35% indicating improved function with less pain   Time 8   Period Weeks   Status New               Plan - 04/10/16 2051    Clinical Impression Statement The patient had a fall on 02/13/16 resulting in a complete right rotator cuff tear.  He underwent mini-open repair with subacromial decompression and distal clavicle resection on 03/06/16.  He is now 5 weeks post op.  He presents without a sling and reports he hasn't worn it in 2 weeks and is unclear of sling usage.  He complains of moderate to severe shoulder pain especially at night time which wakes him up.  He reports he was instructed in pendulum exercises but he does not do them often because they are too painful.  Very painful with attempted passive ROM flexion 45, abduction 30 degrees.  Active ROM and strength testing not performed secondary to surgical precautions following complete rotator cuff repair.  The patient is right hand dominant and can only perform self care/grooming with his left hand only.  He has returned to work as a Runner, broadcasting/film/videoteacher.  The patient is of low complexity evaluation secondary to lack of co-morbidities and stable status.     Rehab Potential Good   Clinical Impairments Affecting Rehab Potential rotator cuff repair for complete tear 03/06/16   PT Frequency 2x / week   PT Duration 8 weeks   PT Treatment/Interventions ADLs/Self Care Home Management;Cryotherapy;Electrical Stimulation;Moist Heat;Therapeutic exercise;Patient/family education;Passive range of motion;Manual techniques;Taping;Vasopneumatic Device   PT Next Visit Plan Passive ROM right shoulder;  e-stim for pain control;  biceps; scapular retractions;  rotator cuff repair for complete tear       Patient will benefit from skilled therapeutic intervention in order to improve the following deficits and impairments:  Decreased range of motion, Decreased strength, Impaired  perceived functional ability, Impaired UE functional use, Pain, Increased edema  Visit Diagnosis: Acute pain of right shoulder - Plan: PT plan of care cert/re-cert  Stiffness of right shoulder, not elsewhere classified - Plan: PT plan of care cert/re-cert  Muscle weakness (generalized) - Plan: PT plan of care cert/re-cert     Problem List Patient Active Problem List   Diagnosis Date Noted  . Obesity 07/18/2015  . OSA (obstructive sleep apnea) 06/07/2013   Lavinia SharpsStacy Danaya Geddis, PT 04/10/16 9:31 PM Phone: [phone] Fax: [phone]  Vivien PrestoSimpson, Damari Suastegui C 04/10/2016, 9:30 PM  [facility] [address] [place], [place], [address] Phone: [phone]   Fax:  [phone]  Name: Kerry Reynolds MRN: [id number] Date of Birth: October 31, 1955

## 2016-04-10 NOTE — Patient Instructions (Signed)
    ROM: Pendulum (Circular)  Let right arm move in circle clockwise, then counterclockwise, by rocking body weight in circular pattern. Circle _10___ times each direction per set. Do _3___ sessions per day.  Pendulum Side to Side  Bend forward 90 at waist, leaning on table for support. Rock body from side to side and let arm swing freely. Repeat _10___ times. Do __3__ sessions per day.  Finger Flexors  Keeping right fingertips straight, press putty toward base of palm. Repeat __20__ times. Do __3__ sessions per day. Activity: Squeeze flour sifter, plastic squeeze bottles, [place]key baster, juice from fruit.*  AROM: Elbow Flexion / Extension  With left hand palm up, gently bend elbow as far as possible. Then straighten arm as far as possible. Repeat __10__ times per set. Do __3__ sessions per day.  SHOULDER: Flexion On Table  Place hands on table, elbows straight. Move hips away from body. Press hands down into table. Hold _3__ seconds. _10__ reps per set, __3_ sets per day.  Elevation: Shrug (Distal Resist)  back   Lift shoulders straight up, then return. Maintain same speed up and down. Avoid moving head and neck forward. Repeat _10___ times per set. Do __1-2__ sets per session. Do many times daily. Copyright  VHI. All rights reserved.   Cold pack following  [facility] [address], [address] [place], [place] [address] Phone # [phone] Fax [phone]   Lavinia SharpsStacy Demaris Leavell PT [facility] [address], [address] [place], [place] [address] Phone # [phone] Fax [phone]

## 2016-04-15 ENCOUNTER — Ambulatory Visit: Payer: Worker's Compensation | Admitting: Physical Therapy

## 2016-04-15 DIAGNOSIS — M25611 Stiffness of right shoulder, not elsewhere classified: Secondary | ICD-10-CM

## 2016-04-15 DIAGNOSIS — M25511 Pain in right shoulder: Secondary | ICD-10-CM | POA: Diagnosis not present

## 2016-04-15 DIAGNOSIS — M6281 Muscle weakness (generalized): Secondary | ICD-10-CM

## 2016-04-15 NOTE — Therapy (Signed)
[facility] [address]  [address] [place], [place], [address] Phone: [phone]   Fax:  [phone]  Physical Therapy Treatment  Patient Details  Name: Regan Rakersdwin L Ulibarri MRN: [id number] Date of Birth: September 10, 1955 Referring Provider: Dr. Cleophas DunkerWhitfield  Encounter Date: 04/15/2016      PT End of Session - 04/15/16 1621    Visit Number 2   Number of Visits 23   Date for PT Re-Evaluation 06/05/16   Authorization Type [employer] 23 visit limit   PT Start Time 1615   PT Stop Time 1706   PT Time Calculation (min) 51 min   Activity Tolerance Patient tolerated treatment well;Patient limited by pain   Behavior During Therapy [facility] for tasks assessed/performed      Past Medical History:  Diagnosis Date  . Anxiety   . Depression   . Diabetes (HCC)    type II  . Heart murmur   . HTN (hypertension)   . Hyperlipidemia   . PONV (postoperative nausea and vomiting)   . Sleep apnea   . Tinnitus     Past Surgical History:  Procedure Laterality Date  . BACK SURGERY     lower back  . COLONOSCOPY    . [facility]ERNIA REPAIR     [employer]  . LACERATION REPAIR Right    arm  . SHOULDER ARTHROSCOPY WITH OPEN ROTATOR CUFF REPAIR AND DISTAL CLAVICLE ACROMINECTOMY Right 03/06/2016   Procedure: SHOULDER ARTHROSCOPY WITH SUBACROMIAL DECOMPRESSION, DISTAL CLAVICLE RESECTION, MINI-OPEN ROTATOR CUFF REPAIR, POSSIBLE [facility] PATCH;  Surgeon: Valeria BatmanPeter W Whitfield, MD;  Location: [facility] OR;  Service: Orthopedics;  Laterality: Right;  Needs RNFA  . WISDOM TOOTH EXTRACTION      There were no vitals filed for this visit.      Subjective Assessment - 04/15/16 1618    Subjective Pt noting increased pain/throbbing with any attempts at exercise or use of R UE. Denies any concerns with HEP other than increase in pain which settles down shortly after completing exercises.   Patient Stated Goals I need to be more active (get back on treadmill);  sleep all night long   Currently in  Pain? Yes   Pain Score 8             [facility] PT Assessment - 04/15/16 1615      Assessment   Next MD Visit 05/05/16                     [facility] Treatment/Exercise - 04/15/16 1615      Exercises   Exercises Shoulder     Shoulder Exercises: Supine   External Rotation Right;AAROM;10 reps   External Rotation Limitations wand   Flexion Right;AAROM;10 reps   Flexion Limitations wand     Shoulder Exercises: Seated   Retraction Both;15 reps   Retraction Limitations 3-5" hold   Flexion Right;PROM;10 reps   Flexion Limitations table slides     Shoulder Exercises: ROM/Strengthening   Pendulum R shoulder flex/ext, horiz abd/add, CW/CCW x 10 each - cues to create motion with body weight shift rather than initiating motion from shoulder (pt reporting less pain with proper technique)     Shoulder Exercises: Isometric Strengthening   Flexion Limitations 10x3"   Extension Limitations 10x3"   External Rotation Limitations 10x3"   Internal Rotation Limitations 10x3"   ABduction Limitations 10x3"     Modalities   Modalities Vasopneumatic     Vasopneumatic   Number Minutes Vasopneumatic  15 minutes   Vasopnuematic Location  Shoulder   Vasopneumatic Pressure Low   Vasopneumatic Temperature  Coldest     Manual Therapy   Manual Therapy Joint mobilization;Passive ROM   Joint Mobilization grade 1-2 inf glide   Passive ROM R shoulder PROM all planes to pt tolerance                  PT Short Term Goals - 04/15/16 1700      PT SHORT TERM GOAL #1   Title The patient will initiate knowledge of self care strategies to promote surgical healing (precautionary motion, positioning)   05/08/16   Status On-going     PT SHORT TERM GOAL #2   Title The patient will report a 30% improvement in sleep   Status On-going     PT SHORT TERM GOAL #3   Title Passive and active assisted  flexion/elevation right shoulder  to 140 degrees as needed to progress to active ROM    Status On-going           PT Long Term Goals - 04/15/16 1701      PT LONG TERM GOAL #1   Title The patient will be independent in safe self progression of HEP needed for further improvements in ROM and strength   06/05/16   Status On-going     PT LONG TERM GOAL #2   Title The patient will report a 60% improvement in sleep and pain with usual home /work ADLs    Status On-going     PT LONG TERM GOAL #3   Title Right shoulder flexion/elevation to 120 degrees, external rotation to 50 degrees, internal rotation to L4 region behind back needed for grooming/dressing using right UE   Status On-going     PT LONG TERM GOAL #4   Title Right shoulder strength grossly 3+/5 to 4-/5  needed for light lifting and return to playing bass guitar   Status On-going     PT LONG TERM GOAL #5   Title FOTO functional outcome score improved from 56% limitation to 35% indicating improved function with less pain   Status On-going               Plan - 04/15/16 1701    Clinical Impression Statement Pt reporting significant pain with all activities with R UE including HEP and attempting to resume normal use of R UE. Cautioned pt that he should not be attempting to functionally use R UE at this time. Reviewed HEP exercises with pt requiring cues to demonstrate exercises appropriately, but reporting less pain with correct technique. Initiated limited progression to AAROM in supine and isometric strengthening but remains in late phase III/early phase IV of RTC protocol despite nearing 6 weeks post-op.   Rehab Potential Good   Clinical Impairments Affecting Rehab Potential rotator cuff repair for complete tear 03/06/16   PT Treatment/Interventions ADLs/Self Care Home Management;Cryotherapy;Electrical Stimulation;Moist Heat;Therapeutic exercise;Patient/family education;Passive range of motion;Manual techniques;Taping;Vasopneumatic Device   PT Next Visit Plan Passive ROM right shoulder; vasopneumatic  compression/ice =/- e-stim PRN for pain control;  biceps; scapular retractions; Progress to AAROM per rotator cuff repair protocol for complete tear    Consulted and Agree with Plan of Care Patient      Patient will benefit from skilled therapeutic intervention in order to improve the following deficits and impairments:  Decreased range of motion, Decreased strength, Impaired perceived functional ability, Impaired UE functional use, Pain, Increased edema  Visit Diagnosis: Acute pain of right  shoulder  Stiffness of right shoulder, not elsewhere classified  Muscle weakness (generalized)     Problem List Patient Active Problem List   Diagnosis Date Noted  . Obesity 07/18/2015  . OSA (obstructive sleep apnea) 06/07/2013    Marry Guan, PT, MPT 04/15/2016, 5:15 PM  [facility] [address]  [address] [place], [place], [address] Phone: [phone]   Fax:  [phone]  Name: ERNIE KASLER MRN: [id number] Date of Birth: Oct 22, 1955

## 2016-04-21 ENCOUNTER — Ambulatory Visit: Payer: 59 | Admitting: Physical Therapy

## 2016-04-22 ENCOUNTER — Ambulatory Visit: Payer: Worker's Compensation | Admitting: Physical Therapy

## 2016-04-22 DIAGNOSIS — M25511 Pain in right shoulder: Secondary | ICD-10-CM

## 2016-04-22 DIAGNOSIS — M25611 Stiffness of right shoulder, not elsewhere classified: Secondary | ICD-10-CM

## 2016-04-22 DIAGNOSIS — M6281 Muscle weakness (generalized): Secondary | ICD-10-CM

## 2016-04-22 NOTE — Therapy (Signed)
[facility] [address]  [address] [place], [place], [address] Phone: [phone]   Fax:  [phone]  Physical Therapy Treatment  Patient Details  Name: Kerry Reynolds MRN: [id number] Date of Birth: 10-16-1955 Referring Provider: Dr. Cleophas Dunker  Encounter Date: 04/22/2016      PT End of Session - 04/22/16 1618    Visit Number 3   Number of Visits 23   Date for PT Re-Evaluation 06/05/16   Authorization Type U[employer] 23 visit limit   PT Start Time 1618   PT Stop Time 1723   PT Time Calculation (min) 65 min   Activity Tolerance Patient tolerated treatment well;Patient limited by pain   Behavior During Therapy [facility] for tasks assessed/performed      Past Medical History:  Diagnosis Date  . Anxiety   . Depression   . Diabetes (HCC)    type II  . Heart murmur   . HTN (hypertension)   . Hyperlipidemia   . PONV (postoperative nausea and vomiting)   . Sleep apnea   . Tinnitus     Past Surgical History:  Procedure Laterality Date  . BACK SURGERY     lower back  . COLONOSCOPY    . [facility]ERNIA REPAIR     [employer]  . LACERATION REPAIR Right    arm  . SHOULDER ARTHROSCOPY WITH OPEN ROTATOR CUFF REPAIR AND DISTAL CLAVICLE ACROMINECTOMY Right 03/06/2016   Procedure: SHOULDER ARTHROSCOPY WITH SUBACROMIAL DECOMPRESSION, DISTAL CLAVICLE RESECTION, MINI-OPEN ROTATOR CUFF REPAIR, POSSIBLE [facility] PATCH;  Surgeon: Valeria Batman, MD;  Location: [facility] OR;  Service: Orthopedics;  Laterality: Right;  Needs RNFA  . WISDOM TOOTH EXTRACTION      There were no vitals filed for this visit.      Subjective Assessment - 04/22/16 1622    Subjective Pt reports shoulder seems to be improving, with pain down from 70% to 50%.   Patient Stated Goals I need to be more active (get back on treadmill);  sleep all night long   Currently in Pain? Yes   Pain Score 5    Pain Location Shoulder   Pain Orientation Right   Pain Descriptors / Indicators  Sharp;Dull  pulling   Pain Type Surgical pain   Pain Radiating Towards n/a   Pain Onset More than a month ago   Pain Frequency Intermittent   Aggravating Factors  reaching to shower or put belt on    Pain Relieving Factors rest   Effect of Pain on Daily Activities limits use of R UE with ADL's                         [facility] Adult PT Treatment/Exercise - 04/22/16 1618      Shoulder Exercises: Supine   Protraction Right;AAROM;10 reps   Protraction Limitations PT guiding motion   External Rotation Right;AAROM;15 reps   External Rotation Limitations wand   Flexion Right;AAROM;10 reps;15 reps   Flexion Limitations wand     Shoulder Exercises: Seated   Retraction Both;15 reps   Retraction Limitations 5" hold     Shoulder Exercises: Pulleys   Flexion 3 minutes   ABduction 3 minutes     Shoulder Exercises: ROM/Strengthening   Pendulum R shoulder flex/ext, horiz abd/add, CW/CCW x 10 each - cues to create motion with body weight shift rather than initiating motion from shoulder (pt reporting less pain with proper technique)   Rhythmic Stabilization, Supine R shoulder at  90 dg flexion 2x30'     Shoulder Exercises: Isometric Strengthening   Flexion 5X5"   Flexion Limitations 2 sets - standing @ wall   Extension 5X5"   Extension Limitations 2 sets - standing @ wall   External Rotation 5X5"   External Rotation Limitations 2 sets - standing @ wall   Internal Rotation 5X5"   Internal Rotation Limitations 2 sets - standing @ wall   ABduction 5X5"   ABduction Limitations 2 sets - standing @ wall     Modalities   Modalities Vasopneumatic     Vasopneumatic   Number Minutes Vasopneumatic  15 minutes   Vasopnuematic Location  Shoulder   Vasopneumatic Pressure Low   Vasopneumatic Temperature  Coldest     Manual Therapy   Manual Therapy Joint mobilization;Passive ROM   Joint Mobilization grade 1-2 inf glide   Passive ROM R shoulder PROM all planes to pt tolerance                   PT Short Term Goals - 04/15/16 1700      PT SHORT TERM GOAL #1   Title The patient will initiate knowledge of self care strategies to promote surgical healing (precautionary motion, positioning)   05/08/16   Status On-going     PT SHORT TERM GOAL #2   Title The patient will report a 30% improvement in sleep   Status On-going     PT SHORT TERM GOAL #3   Title Passive and active assisted  flexion/elevation right shoulder  to 140 degrees as needed to progress to active ROM   Status On-going           PT Long Term Goals - 04/15/16 1701      PT LONG TERM GOAL #1   Title The patient will be independent in safe self progression of HEP needed for further improvements in ROM and strength   06/05/16   Status On-going     PT LONG TERM GOAL #2   Title The patient will report a 60% improvement in sleep and pain with usual home /work ADLs    Status On-going     PT LONG TERM GOAL #3   Title Right shoulder flexion/elevation to 120 degrees, external rotation to 50 degrees, internal rotation to L4 region behind back needed for grooming/dressing using right UE   Status On-going     PT LONG TERM GOAL #4   Title Right shoulder strength grossly 3+/5 to 4-/5  needed for light lifting and return to playing bass guitar   Status On-going     PT LONG TERM GOAL #5   Title FOTO functional outcome score improved from 56% limitation to 35% indicating improved function with less pain   Status On-going               Plan - 04/22/16 1627    Clinical Impression Statement Briefly reviewed pendulum exercises with pt now consistently using momentum to initiate motion for all directions. Initiated pulleys, but pt demonstrated limited tolerance due to muscle guarding. Pt demonstrating better tolerance for wand exercises today, therefore added these and shoulder isometrics to HEP. Overall, pt reporting pain improving but ROM remains slow to progress.   Rehab Potential Good    Clinical Impairments Affecting Rehab Potential rotator cuff repair for complete tear 03/06/16   PT Treatment/Interventions ADLs/Self Care Home Management;Cryotherapy;Electrical Stimulation;Moist Heat;Therapeutic exercise;Patient/family education;Passive range of motion;Manual techniques;Taping;Vasopneumatic Device   PT Next Visit Plan Passive ROM right shoulder; vasopneumatic  compression/ice +/- e-stim PRN for pain control;  biceps; Progress scapular retraction; Progress AAROM per rotator cuff repair protocol for complete tear    Consulted and Agree with Plan of Care Patient      Patient will benefit from skilled therapeutic intervention in order to improve the following deficits and impairments:  Decreased range of motion, Decreased strength, Impaired perceived functional ability, Impaired UE functional use, Pain, Increased edema  Visit Diagnosis: Acute pain of right shoulder  Stiffness of right shoulder, not elsewhere classified  Muscle weakness (generalized)     Problem List Patient Active Problem List   Diagnosis Date Noted  . Obesity 07/18/2015  . OSA (obstructive sleep apnea) 06/07/2013    Marry GuanJoAnne M Haider Hornaday, PT, MPT 04/22/2016, 5:27 PM  [facility] [address]  [address] [place], [place], [address] Phone: [phone]   Fax:  [phone]  Name: Regan Rakersdwin L Every MRN: [id number] Date of Birth: December 27, 1955

## 2016-04-24 ENCOUNTER — Ambulatory Visit: Payer: Worker's Compensation | Admitting: Physical Therapy

## 2016-04-28 ENCOUNTER — Telehealth (INDEPENDENT_AMBULATORY_CARE_PROVIDER_SITE_OTHER): Payer: Self-pay | Admitting: Orthopaedic Surgery

## 2016-04-29 ENCOUNTER — Ambulatory Visit: Payer: Worker's Compensation

## 2016-04-29 DIAGNOSIS — M25511 Pain in right shoulder: Secondary | ICD-10-CM

## 2016-04-29 DIAGNOSIS — M25611 Stiffness of right shoulder, not elsewhere classified: Secondary | ICD-10-CM

## 2016-04-29 DIAGNOSIS — M6281 Muscle weakness (generalized): Secondary | ICD-10-CM

## 2016-04-29 NOTE — Therapy (Signed)
[facility] [address]  [address] [place], [place], [address] Phone: [phone]   Fax:  [phone]  Physical Therapy Treatment  Patient Details  Name: Kerry Reynolds MRN: [id number] Date of Birth: 1955/10/25 Referring Provider: Dr. Cleophas Dunker  Encounter Date: 04/29/2016      PT End of Session - 04/29/16 1624    Visit Number 4   Number of Visits 23   Date for PT Re-Evaluation 06/05/16   Authorization Type UHC 23 visit limit   PT Start Time 1619   PT Stop Time 1707   PT Time Calculation (min) 48 min   Activity Tolerance Patient tolerated treatment well;Patient limited by pain   Behavior During Therapy [facility] for tasks assessed/performed      Past Medical History:  Diagnosis Date  . Anxiety   . Depression   . Diabetes (HCC)    type II  . Heart murmur   . HTN (hypertension)   . Hyperlipidemia   . PONV (postoperative nausea and vomiting)   . Sleep apnea   . Tinnitus     Past Surgical History:  Procedure Laterality Date  . BACK SURGERY     lower back  . COLONOSCOPY    . HERNIA REPAIR     [employer]  . LACERATION REPAIR Right    arm  . SHOULDER ARTHROSCOPY WITH OPEN ROTATOR CUFF REPAIR AND DISTAL CLAVICLE ACROMINECTOMY Right 03/06/2016   Procedure: SHOULDER ARTHROSCOPY WITH SUBACROMIAL DECOMPRESSION, DISTAL CLAVICLE RESECTION, MINI-OPEN ROTATOR CUFF REPAIR, POSSIBLE [facility] PATCH;  Surgeon: Valeria Batman, MD;  Location: [facility] OR;  Service: Orthopedics;  Laterality: Right;  Needs RNFA  . WISDOM TOOTH EXTRACTION      There were no vitals filed for this visit.      Subjective Assessment - 04/29/16 1620    Subjective Pt. reporting writting on white board is greatest challange at this point with R shoulder.     Patient Stated Goals I need to be more active (get back on treadmill);  sleep all night long   Currently in Pain? Yes   Pain Score 2    Pain Location Shoulder   Pain Orientation Right   Pain Descriptors /  Indicators Dull   Pain Type Surgical pain   Pain Onset More than a month ago   Pain Frequency Intermittent   Aggravating Factors  Reaching to shower or putting belt on, writting on white board   Pain Relieving Factors rest   Effect of Pain on Daily Activities limits use of R UE with ADL's    Multiple Pain Sites No            [facility] Adult PT Treatment/Exercise - 04/29/16 1640      Shoulder Exercises: Supine   Protraction Right;AAROM;15 reps   Protraction Limitations therapist guided    External Rotation Right;AAROM;20 reps   External Rotation Limitations wand   Flexion Right;AAROM;20 reps   Flexion Limitations wand     Shoulder Exercises: Seated   Retraction Both;10 reps   Retraction Limitations 10" hold      Shoulder Exercises: Pulleys   Flexion 3 minutes   Flexion Limitations decreased muscular guarding today   ABduction 3 minutes   ABduction Limitations decreased muscular guarding today     Shoulder Exercises: Isometric Strengthening   Flexion 5X10"   Flexion Limitations 1 set - standing @ wall   Extension 5X10"   Extension Limitations 1 set - standing @ wall   External Rotation 5X10"  External Rotation Limitations 1 set - standing @ wall   Internal Rotation 5X10"   Internal Rotation Limitations 1 set - standing @ wall   ABduction 5X10"   ABduction Limitations 1 set - standing @ wall     Manual Therapy   Manual Therapy Passive ROM   Passive ROM R shoulder PROM all planes to pt tolerance            PT Short Term Goals - 04/15/16 1700      PT SHORT TERM GOAL #1   Title The patient will initiate knowledge of self care strategies to promote surgical healing (precautionary motion, positioning)   05/08/16   Status On-going     PT SHORT TERM GOAL #2   Title The patient will report a 30% improvement in sleep   Status On-going     PT SHORT TERM GOAL #3   Title Passive and active assisted  flexion/elevation right shoulder  to 140 degrees as needed to progress  to active ROM   Status On-going           PT Long Term Goals - 04/15/16 1701      PT LONG TERM GOAL #1   Title The patient will be independent in safe self progression of HEP needed for further improvements in ROM and strength   06/05/16   Status On-going     PT LONG TERM GOAL #2   Title The patient will report a 60% improvement in sleep and pain with usual home /work ADLs    Status On-going     PT LONG TERM GOAL #3   Title Right shoulder flexion/elevation to 120 degrees, external rotation to 50 degrees, internal rotation to L4 region behind back needed for grooming/dressing using right UE   Status On-going     PT LONG TERM GOAL #4   Title Right shoulder strength grossly 3+/5 to 4-/5  needed for light lifting and return to playing bass guitar   Status On-going     PT LONG TERM GOAL #5   Title FOTO functional outcome score improved from 56% limitation to 35% indicating improved function with less pain   Status On-going               Plan - 04/29/16 1628    Clinical Impression Statement Pt. reporting he is able to sleep through the night with exception of waking up once last night due to R shoulder pain.  Hold times with isometrics progressed today along with reps with wand AAROM activities.  Pt. with good overall technique with all therex however still with some difficulty relaxing with pulleys today.  Pt. reporting he has been performing updated HEP activities without issue.  Pt. requesting to skip ice/compression today noting this has had limited effect on R shoulder pain.  Pt. will continue to benefit from further skilled therapy to improve R shoulder strength, ROM, and maximize function.     PT Treatment/Interventions ADLs/Self Care Home Management;Cryotherapy;Electrical Stimulation;Moist Heat;Therapeutic exercise;Patient/family education;Passive range of motion;Manual techniques;Taping;Vasopneumatic Device   PT Next Visit Plan Passive ROM right shoulder; vasopneumatic  compression/ice +/- e-stim PRN for pain control;  biceps; Progress scapular retraction; Progress AAROM per rotator cuff repair protocol for complete tear       Patient will benefit from skilled therapeutic intervention in order to improve the following deficits and impairments:  Decreased range of motion, Decreased strength, Impaired perceived functional ability, Impaired UE functional use, Pain, Increased edema  Visit Diagnosis: Acute pain of right  shoulder  Stiffness of right shoulder, not elsewhere classified  Muscle weakness (generalized)     Problem List Patient Active Problem List   Diagnosis Date Noted  . Obesity 07/18/2015  . OSA (obstructive sleep apnea) 06/07/2013    Kermit Balo, PTA 04/30/16 8:37 AM  [facility] [address]  [address] [place], [place], [address] Phone: [phone]   Fax:  [phone]  Name: JAAZIEL PEATROSS MRN: [id number] Date of Birth: [birth year]

## 2016-05-01 ENCOUNTER — Ambulatory Visit: Payer: Worker's Compensation | Admitting: Physical Therapy

## 2016-05-01 DIAGNOSIS — M6281 Muscle weakness (generalized): Secondary | ICD-10-CM

## 2016-05-01 DIAGNOSIS — M25511 Pain in right shoulder: Secondary | ICD-10-CM

## 2016-05-01 DIAGNOSIS — M25611 Stiffness of right shoulder, not elsewhere classified: Secondary | ICD-10-CM

## 2016-05-01 NOTE — Therapy (Signed)
[facility] [address]  [address] [place], [place], [address] Phone: [phone]   Fax:  [phone]  Physical Therapy Treatment  Patient Details  Name: Kerry Reynolds MRN: [id number] Date of Birth: 1955/05/03 Referring Provider: [name], MD  Encounter Date: 05/01/2016      PT End of Session - 05/01/16 1615    Visit Number 5   Number of Visits 23   Date for PT Re-Evaluation 06/05/16   Authorization Type UHC 23 visit limit   PT Start Time 1615   PT Stop Time 1716   PT Time Calculation (min) 61 min   Activity Tolerance Patient tolerated treatment well;Patient limited by pain   Behavior During Therapy [facility] for tasks assessed/performed      Past Medical History:  Diagnosis Date  . Anxiety   . Depression   . Diabetes (HCC)    type II  . Heart murmur   . HTN (hypertension)   . Hyperlipidemia   . PONV (postoperative nausea and vomiting)   . Sleep apnea   . Tinnitus     Past Surgical History:  Procedure Laterality Date  . BACK SURGERY     lower back  . COLONOSCOPY    . HERNIA REPAIR     [employer]  . LACERATION REPAIR Right    arm  . SHOULDER ARTHROSCOPY WITH OPEN ROTATOR CUFF REPAIR AND DISTAL CLAVICLE ACROMINECTOMY Right 03/06/2016   Procedure: SHOULDER ARTHROSCOPY WITH SUBACROMIAL DECOMPRESSION, DISTAL CLAVICLE RESECTION, MINI-OPEN ROTATOR CUFF REPAIR, POSSIBLE [facility] PATCH;  Surgeon: Valeria Batman, MD;  Location: [facility] OR;  Service: Orthopedics;  Laterality: Right;  Needs RNFA  . WISDOM TOOTH EXTRACTION      There were no vitals filed for this visit.      Subjective Assessment - 05/01/16 1619    Subjective Pt reports resting pain in R shoulder is typically down to ~1/10 most of the time.   Patient Stated Goals I need to be more active (get back on treadmill);  sleep all night long   Currently in Pain? Yes   Pain Score 1    Pain Location Shoulder   Pain Orientation Right   Pain Descriptors /  Indicators Dull   Pain Type Surgical pain   Pain Onset More than a month ago   Pain Frequency Intermittent            [facility] PT Assessment - 05/01/16 1615      Assessment   Medical Diagnosis Right rotator cuff repair   Referring Provider [name], MD   Onset Date/Surgical Date 03/06/16   Hand Dominance Right   Next MD Visit 05/05/16     Precautions   Precautions Shoulder   Type of Shoulder Precautions rotator cuff precautions     PROM   Overall PROM Comments pain at end range all motions with continued muscle guarding   Right Shoulder Flexion 120 Degrees   Right Shoulder ABduction 83 Degrees   Right Shoulder Internal Rotation 71 Degrees   Right Shoulder External Rotation 44 Degrees                     [facility] Adult PT Treatment/Exercise - 05/01/16 1615      Shoulder Exercises: Supine   External Rotation Right;AAROM;20 reps   External Rotation Limitations wand   Flexion Right;AAROM;20 reps   Flexion Limitations wand     Shoulder Exercises: Prone   Extension Right;10 reps   Extension Limitations to  neutral + scap retraction, 3" hold     Shoulder Exercises: Standing   Row Both;15 reps;Theraband   Theraband Level (Shoulder Row) Level 1 (Yellow)   Row Weight (lbs) 5" hold   Retraction Both;15 reps;Theraband   Theraband Level (Shoulder Retraction) Level 1 (Yellow)   Retraction Weight (lbs) + shoulder extension to neutral, 5" hold     Shoulder Exercises: Pulleys   Flexion 3 minutes   ABduction 3 minutes     Modalities   Modalities Vasopneumatic     Vasopneumatic   Number Minutes Vasopneumatic  15 minutes   Vasopnuematic Location  Shoulder   Vasopneumatic Pressure Low   Vasopneumatic Temperature  Coldest     Manual Therapy   Manual Therapy Joint mobilization;Passive ROM;Soft tissue mobilization   Joint Mobilization grade 1-2 inf & A/P glide R shoulder   Soft tissue mobilization R pecs, periscapular & RTC muscles   Passive ROM R shoulder PROM  all planes to pt tolerance                  PT Short Term Goals - 05/01/16 1622      PT SHORT TERM GOAL #1   Title The patient will initiate knowledge of self care strategies to promote surgical healing (precautionary motion, positioning)   05/08/16   Status Achieved     PT SHORT TERM GOAL #2   Title The patient will report a 30% improvement in sleep   Status Achieved     PT SHORT TERM GOAL #3   Title Passive and active assisted  flexion/elevation right shoulder  to 140 degrees as needed to progress to active ROM   Status On-going           PT Long Term Goals - 04/15/16 1701      PT LONG TERM GOAL #1   Title The patient will be independent in safe self progression of HEP needed for further improvements in ROM and strength   06/05/16   Status On-going     PT LONG TERM GOAL #2   Title The patient will report a 60% improvement in sleep and pain with usual home /work ADLs    Status On-going     PT LONG TERM GOAL #3   Title Right shoulder flexion/elevation to 120 degrees, external rotation to 50 degrees, internal rotation to L4 region behind back needed for grooming/dressing using right UE   Status On-going     PT LONG TERM GOAL #4   Title Right shoulder strength grossly 3+/5 to 4-/5  needed for light lifting and return to playing bass guitar   Status On-going     PT LONG TERM GOAL #5   Title FOTO functional outcome score improved from 56% limitation to 35% indicating improved function with less pain   Status On-going               Plan - 05/01/16 1649    Clinical Impression Statement Pt has demonstrated excellent progress with R shoulder ROM since starting PT 3 weeks ago, although AAROM better tolerated than PROM due continued extensive muscle guarding during PROM attempts despite STM and gentle joint mobs to promote relaxation. Due to this, delayed start of PT and pt's early attempts to progress active use of R UE and premature self-weaning from sling  resulting in significant shoulder irritation and increased pain in early PT visits, pt is behind in protocol progression but is demonstrating steady gains with decreasing pain reports. PT will continue to benefit  from skilled PT to restore R shoudler ROM and functional use of R UE with daily tasks.   Rehab Potential Good   Clinical Impairments Affecting Rehab Potential rotator cuff repair for complete tear 03/06/16   PT Treatment/Interventions ADLs/Self Care Home Management;Cryotherapy;Electrical Stimulation;Moist Heat;Therapeutic exercise;Patient/family education;Passive range of motion;Manual techniques;Taping;Vasopneumatic Device   PT Next Visit Plan Joint mobs/manual therapy & PROM right shoulder to restore full ROM; Progress AAROM per rotator cuff repair protocol for complete tear; Progress scapular retraction & gentle RTC strengthening as ROM improves; Vasopneumatic compression/ice +/- e-stim PRN for pain control   Consulted and Agree with Plan of Care Patient      Patient will benefit from skilled therapeutic intervention in order to improve the following deficits and impairments:  Decreased range of motion, Decreased strength, Impaired perceived functional ability, Impaired UE functional use, Pain, Increased edema  Visit Diagnosis: Acute pain of right shoulder  Stiffness of right shoulder, not elsewhere classified  Muscle weakness (generalized)     Problem List Patient Active Problem List   Diagnosis Date Noted  . Obesity 07/18/2015  . OSA (obstructive sleep apnea) 06/07/2013    Marry Guan, PT, MPT 05/01/2016, 7:19 PM  [facility] [address]  [address] [place], [place], [address] Phone: [phone]   Fax:  [phone]  Name: Kerry Reynolds MRN: [id number] Date of Birth: [birth year]

## 2016-05-05 ENCOUNTER — Encounter (INDEPENDENT_AMBULATORY_CARE_PROVIDER_SITE_OTHER): Payer: Self-pay | Admitting: Orthopaedic Surgery

## 2016-05-05 ENCOUNTER — Ambulatory Visit (INDEPENDENT_AMBULATORY_CARE_PROVIDER_SITE_OTHER): Payer: Worker's Compensation | Admitting: Orthopaedic Surgery

## 2016-05-05 VITALS — BP 139/96 | HR 102 | Ht 72.0 in | Wt 245.0 lb

## 2016-05-05 DIAGNOSIS — M25511 Pain in right shoulder: Secondary | ICD-10-CM

## 2016-05-05 DIAGNOSIS — G8929 Other chronic pain: Secondary | ICD-10-CM

## 2016-05-05 NOTE — Progress Notes (Signed)
   Office Visit Note   Patient: Kerry Reynolds           Date of Birth: 1955/11/01           MRN: [id number] Visit Date: 05/05/2016              Requested by: Elias Elseobert Reade, MD [phone] [address] [address] [place], [place] [address] PCP: Lolita PatellaEADE,ROBERT ALEXANDER, MD   Assessment & Plan: Visit Diagnoses: 2 months status post rotator cuff tear repair right shoulder-doing well with physical therapy. Early adhesive capsulitis. I have purposefully been conservative with his physical therapy  given the size of his tear.  Plan: Continue physical therapy, aggressive home exercises follow-up in one month  Follow-Up Instructions: No Follow-up on file.   Orders:  No orders of the defined types were placed in this encounter.  No orders of the defined types were placed in this encounter.     Procedures: No procedures performed   Clinical Data: No additional findings.   Subjective: Chief Complaint  Patient presents with  . Right Shoulder - Routine Post Op    11 weeks status post Right SHOULDER ARTHROSCOPY WITH OPEN ROTATOR CUFF REPAIR AND DISTAL CLAVICLE ACROMINECTOMY  Mr. Kerry Reynolds is status post rotator cuff tear repair November 30. He has been involved with physical therapy and feels like he is making progress. 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  Review of Systems   Objective: Vital Signs: There were no vitals taken for this visit.  Physical Exam  Ortho Exam right shoulder exam with approximately 90 of abduction. Approximately 120 of forward flexion at which point he was tight. Incisions of healed nicely. Neurovascular exam intact. No impingement.  Specialty Comments:  No specialty comments available.  Imaging: No results found.   PMFS History: Patient Active Problem List   Diagnosis Date Noted  . Obesity 07/18/2015  . OSA (obstructive sleep apnea) 06/07/2013   Past Medical History:  Diagnosis Date  . Anxiety   . Depression   . Diabetes (HCC)    type II  . Heart  murmur   . HTN (hypertension)   . Hyperlipidemia   . PONV (postoperative nausea and vomiting)   . Sleep apnea   . Tinnitus     Family History  Problem Relation Age of Onset  . Breast cancer Mother   . Diabetes Maternal Grandmother     Past Surgical History:  Procedure Laterality Date  . BACK SURGERY     lower back  . COLONOSCOPY    . HERNIA REPAIR     [employer]  . LACERATION REPAIR Right    arm  . SHOULDER ARTHROSCOPY WITH OPEN ROTATOR CUFF REPAIR AND DISTAL CLAVICLE ACROMINECTOMY Right 03/06/2016   Procedure: SHOULDER ARTHROSCOPY WITH SUBACROMIAL DECOMPRESSION, DISTAL CLAVICLE RESECTION, MINI-OPEN ROTATOR CUFF REPAIR, [facility];  Surgeon: [place] BatmanPeter W Whitfield, MD;  Location: [facility] OR;  Service: Orthopedics;  Laterality: Right;  Needs RNFA  . WISDOM TOOTH EXTRACTION     Social History   Occupational History  . teacher    Social History Main Topics  . Smoking status: Never Smoker  . Smokeless tobacco: Never Used  . Alcohol use No  . Drug use: No  . Sexual activity: No

## 2016-05-06 ENCOUNTER — Ambulatory Visit: Payer: Worker's Compensation

## 2016-05-06 DIAGNOSIS — M6281 Muscle weakness (generalized): Secondary | ICD-10-CM

## 2016-05-06 DIAGNOSIS — M25611 Stiffness of right shoulder, not elsewhere classified: Secondary | ICD-10-CM

## 2016-05-06 DIAGNOSIS — M25511 Pain in right shoulder: Secondary | ICD-10-CM

## 2016-05-06 NOTE — Therapy (Signed)
[facility] Outpatient Rehabilitation [facility] [address]  [address] [place], [place], [address] Phone: [phone]   Fax:  [phone]  Physical Therapy Treatment  Patient Details  Name: Kerry Reynolds MRN: [id number] Date of Birth: [birth year] Referring Provider: [name], MD  Encounter Date: 05/06/2016      PT End of Session - 05/06/16 1618    Visit Number 6   Number of Visits 23   Date for PT Re-Evaluation 06/05/16   Authorization Type U[employer] 23 visit limit   PT Start Time 1616   PT Stop Time 1715   PT Time Calculation (min) 59 min   Activity Tolerance Patient tolerated treatment well;Patient limited by pain   Behavior During Therapy [facility] for tasks assessed/performed      Past Medical History:  Diagnosis Date  . Anxiety   . Depression   . Diabetes (HCC)    type II  . Heart murmur   . HTN (hypertension)   . Hyperlipidemia   . PONV (postoperative nausea and vomiting)   . Sleep apnea   . Tinnitus     Past Surgical History:  Procedure Laterality Date  . BACK SURGERY     lower back  . COLONOSCOPY    . [employer]ERNIA REPAIR     [employer]  . LACERATION REPAIR Right    arm  . SHOULDER ARTHROSCOPY WITH OPEN ROTATOR CUFF REPAIR AND DISTAL CLAVICLE ACROMINECTOMY Right 03/06/2016   Procedure: SHOULDER ARTHROSCOPY WITH SUBACROMIAL DECOMPRESSION, DISTAL CLAVICLE RESECTION, MINI-OPEN ROTATOR CUFF REPAIR, POSSIBLE [facility] PATCH;  Surgeon: [place] Batman, MD;  Location: [facility] OR;  Service: Orthopedics;  Laterality: Right;  Needs RNFA  . WISDOM TOOTH EXTRACTION      There were no vitals filed for this visit.      Subjective Assessment - 05/06/16 1619    Subjective Pt. reporting he feels he may have overdone his home exercises yesterday however pain back to normal levels today.     Patient Stated Goals I need to be more active (get back on treadmill);  sleep all night long   Currently in Pain? Yes   Pain Score 1    Pain Location Shoulder   Pain  Orientation Right   Pain Descriptors / Indicators Dull   Pain Type Surgical pain   Pain Onset More than a month ago   Pain Frequency Intermittent   Aggravating Factors  putting on belt    Pain Relieving Factors rest    Effect of Pain on Daily Activities limits use of R UE   Multiple Pain Sites No           [facility] Adult PT Treatment/Exercise - 05/06/16 1634      Shoulder Exercises: Supine   Protraction Right;AAROM;15 reps   Protraction Limitations Serratus punch    External Rotation Right;AAROM;20 reps   External Rotation Limitations wand   Flexion Right;AAROM;20 reps   Flexion Limitations wand     Shoulder Exercises: Standing   Row Both;Theraband;20 reps   Theraband Level (Shoulder Row) Level 1 (Yellow)   Row Limitations 5" hold   Theraband Level (Shoulder Retraction) Level 1 (Yellow)   Retraction Weight (lbs) + shoulder extension to neutral, 5" hold     Shoulder Exercises: Pulleys   Flexion 3 minutes   ABduction 3 minutes     Vasopneumatic   Number Minutes Vasopneumatic  15 minutes   Vasopnuematic Location  Shoulder   Vasopneumatic Pressure Low   Vasopneumatic Temperature  Coldest  Manual Therapy   Manual Therapy Joint mobilization;Passive ROM;Soft tissue mobilization   Joint Mobilization grade 1-2 inf & A/P glide R shoulder   Soft tissue mobilization R pecs, periscapular & RTC muscles   Passive ROM R shoulder PROM all planes to pt tolerance           PT Short Term Goals - 05/01/16 1622      PT SHORT TERM GOAL #1   Title The patient will initiate knowledge of self care strategies to promote surgical healing (precautionary motion, positioning)   05/08/16   Status Achieved     PT SHORT TERM GOAL #2   Title The patient will report a 30% improvement in sleep   Status Achieved     PT SHORT TERM GOAL #3   Title Passive and active assisted  flexion/elevation right shoulder  to 140 degrees as needed to progress to active ROM   Status On-going            PT Long Term Goals - 04/15/16 1701      PT LONG TERM GOAL #1   Title The patient will be independent in safe self progression of HEP needed for further improvements in ROM and strength   06/05/16   Status On-going     PT LONG TERM GOAL #2   Title The patient will report a 60% improvement in sleep and pain with usual home /work ADLs    Status On-going     PT LONG TERM GOAL #3   Title Right shoulder flexion/elevation to 120 degrees, external rotation to 50 degrees, internal rotation to L4 region behind back needed for grooming/dressing using right UE   Status On-going     PT LONG TERM GOAL #4   Title Right shoulder strength grossly 3+/5 to 4-/5  needed for light lifting and return to playing bass guitar   Status On-going     PT LONG TERM GOAL #5   Title FOTO functional outcome score improved from 56% limitation to 35% indicating improved function with less pain   Status On-going               Plan - 05/06/16 1619    Clinical Impression Statement Pt. tolerating all gentle scapular strengthening, PROM, and AAROM with wand well today with pain remaining low throughout treatment.  Pt. able to perform pulleys, wand AAROM, and tolerate PROM with less muscular guarding today.  Correction provided with demo for retraction HEP activity today to prevent active hyperextension at R shoulder.  Pt. reporting he is consistently performing HEP daily now without issue.  Treatment ending with ice/compression with pt. noting continued benefit with this.  Will plan to progress AAROM per protocol in coming visits.  Pt. will continues to benefit from skilled therapy to restore R shoulder ROM and functional per protocol.   PT Treatment/Interventions ADLs/Self Care Home Management;Cryotherapy;Electrical Stimulation;Moist Heat;Therapeutic exercise;Patient/family education;Passive range of motion;Manual techniques;Taping;Vasopneumatic Device   PT Next Visit Plan Joint mobs/manual therapy & PROM right shoulder  to restore full ROM; Progress AAROM per rotator cuff repair protocol for complete tear; Progress scapular retraction & gentle RTC strengthening as ROM improves; Vasopneumatic compression/ice +/- e-stim PRN for pain control      Patient will benefit from skilled therapeutic intervention in order to improve the following deficits and impairments:  Decreased range of motion, Decreased strength, Impaired perceived functional ability, Impaired UE functional use, Pain, Increased edema  Visit Diagnosis: Acute pain of right shoulder  Stiffness of right shoulder, not  elsewhere classified  Muscle weakness (generalized)     Problem List Patient Active Problem List   Diagnosis Date Noted  . Obesity 07/18/2015  . OSA (obstructive sleep apnea) 06/07/2013    Kermit Balo, PTA 05/06/16 6:32 PM  [facility] [address]  [address] [place], [place], [address] Phone: [phone]   Fax:  [phone]  Name: LUKA STOHR MRN: [id number] Date of Birth: 1955/04/23

## 2016-05-08 ENCOUNTER — Ambulatory Visit: Payer: Worker's Compensation | Admitting: Physical Therapy

## 2016-05-13 ENCOUNTER — Ambulatory Visit: Payer: Worker's Compensation | Attending: Orthopaedic Surgery | Admitting: Physical Therapy

## 2016-05-13 DIAGNOSIS — M25611 Stiffness of right shoulder, not elsewhere classified: Secondary | ICD-10-CM | POA: Insufficient documentation

## 2016-05-13 DIAGNOSIS — M6281 Muscle weakness (generalized): Secondary | ICD-10-CM | POA: Insufficient documentation

## 2016-05-13 DIAGNOSIS — M25511 Pain in right shoulder: Secondary | ICD-10-CM | POA: Diagnosis present

## 2016-05-13 NOTE — Therapy (Signed)
[facility] Outpatient Rehabilitation [facility] [address]  [address] [place], [place], [address] Phone: [phone]   Fax:  [phone]  Physical Therapy Treatment  Patient Details  Name: Kerry Reynolds MRN: [id number] Date of Birth: May 08, 1955 Referring Provider: [name], MD  Encounter Date: 05/13/2016      PT End of Session - 05/13/16 1615    Visit Number 7   Number of Visits 23   Date for PT Re-Evaluation 06/05/16   Authorization Type UHC 23 visit limit   PT Start Time 1615   PT Stop Time 1700   PT Time Calculation (min) 45 min   Activity Tolerance Patient tolerated treatment well   Behavior During Therapy [facility] for tasks assessed/performed      Past Medical History:  Diagnosis Date  . Anxiety   . Depression   . Diabetes (HCC)    type II  . Heart murmur   . HTN (hypertension)   . Hyperlipidemia   . PONV (postoperative nausea and vomiting)   . Sleep apnea   . Tinnitus     Past Surgical History:  Procedure Laterality Date  . BACK SURGERY     lower back  . COLONOSCOPY    . HERNIA REPAIR     [employer]  . LACERATION REPAIR Right    arm  . SHOULDER ARTHROSCOPY WITH OPEN ROTATOR CUFF REPAIR AND DISTAL CLAVICLE ACROMINECTOMY Right 03/06/2016   Procedure: SHOULDER ARTHROSCOPY WITH SUBACROMIAL DECOMPRESSION, DISTAL CLAVICLE RESECTION, MINI-OPEN ROTATOR CUFF REPAIR, POSSIBLE [facility] PATCH;  Surgeon: [place] Batman, MD;  Location: [facility] OR;  Service: Orthopedics;  Laterality: Right;  Needs RNFA  . WISDOM TOOTH EXTRACTION      There were no vitals filed for this visit.      Subjective Assessment - 05/13/16 1619    Subjective Pt noting pain is very mild, more tension, along upper shoulder near clavicle. Notes continued tightness.   Patient Stated Goals I need to be more active (get back on treadmill);  sleep all night long   Currently in Pain? Yes   Pain Score --  0.5/10   Pain Location Shoulder   Pain Orientation Right;Upper   Pain  Descriptors / Indicators --  tension   Pain Type Surgical pain   Pain Onset More than a month ago   Pain Frequency Intermittent            [facility] PT Assessment - 05/13/16 1615      PROM   Right Shoulder Flexion 135 Degrees  AAROM with wand                     [facility] Adult PT Treatment/Exercise - 05/13/16 1615      Shoulder Exercises: Supine   Protraction Right;10 reps;Weights   Protraction Weight (lbs) 2#   Protraction Limitations Serratus punch    Flexion Right;AAROM;20 reps;Weights   Shoulder Flexion Weight (lbs) 2# cuff wt on cane   Other Supine Exercises R shoulder circles at 90 dg flexion 1# CW/CCW x10 each     Shoulder Exercises: Prone   Retraction Right;10 reps   Retraction Limitations row with 3" hold   Extension Right;10 reps   Extension Limitations to neutral + scap retraction, 3" hold     Shoulder Exercises: Standing   Flexion Right;AAROM;5 reps   Flexion Limitations wall ladder     Shoulder Exercises: Pulleys   Flexion 3 minutes   ABduction 3 minutes   ABduction Limitations scaption  Shoulder Exercises: Therapy Ball   Flexion 5 reps   Flexion Limitations seated Pball roll-out with green (65 cm) Pball, 5" stretch at end flexion ROM     Shoulder Exercises: ROM/Strengthening   UBE (Upper Arm Bike) lvl 1.0 fwb/back 2' each   Rhythmic Stabilization, Supine R shoulder at 90 dg flexion 2x30'     Manual Therapy   Manual Therapy Joint mobilization;Soft tissue mobilization;Passive ROM   Joint Mobilization grade 2-3 inf & A/P glide R shoulder   Soft tissue mobilization R pecs, periscapular & RTC muscles   Passive ROM R shoulder PROM all planes to pt tolerance                  PT Short Term Goals - 05/01/16 1622      PT SHORT TERM GOAL #1   Title The patient will initiate knowledge of self care strategies to promote surgical healing (precautionary motion, positioning)   05/08/16   Status Achieved     PT SHORT TERM GOAL #2   Title  The patient will report a 30% improvement in sleep   Status Achieved     PT SHORT TERM GOAL #3   Title Passive and active assisted  flexion/elevation right shoulder  to 140 degrees as needed to progress to active ROM   Status On-going           PT Long Term Goals - 04/15/16 1701      PT LONG TERM GOAL #1   Title The patient will be independent in safe self progression of HEP needed for further improvements in ROM and strength   06/05/16   Status On-going     PT LONG TERM GOAL #2   Title The patient will report a 60% improvement in sleep and pain with usual home /work ADLs    Status On-going     PT LONG TERM GOAL #3   Title Right shoulder flexion/elevation to 120 degrees, external rotation to 50 degrees, internal rotation to L4 region behind back needed for grooming/dressing using right UE   Status On-going     PT LONG TERM GOAL #4   Title Right shoulder strength grossly 3+/5 to 4-/5  needed for light lifting and return to playing bass guitar   Status On-going     PT LONG TERM GOAL #5   Title FOTO functional outcome score improved from 56% limitation to 35% indicating improved function with less pain   Status On-going               Plan - 05/13/16 1624    Clinical Impression Statement Pt reports R shoulder pain now only minimal (typically <1/10) except when exercising/stretching. Continue to progress scapular strengthening/stabilization with addition of prone exercises and serratus activation. R shoulder AAROM continues to improve with pt demonstrating ~135 dg flexion during supine wand exercises and pt able to initiate wall ladder & Pball flexion roll-outs in sitting without significant subsitution.   Rehab Potential Good   Clinical Impairments Affecting Rehab Potential rotator cuff repair for complete tear 03/06/16   PT Treatment/Interventions ADLs/Self Care Home Management;Cryotherapy;Electrical Stimulation;Moist Heat;Therapeutic exercise;Patient/family  education;Passive range of motion;Manual techniques;Taping;Vasopneumatic Device   PT Next Visit Plan Joint mobs/manual therapy & PROM right shoulder to restore full ROM; Progress AAROM per rotator cuff repair protocol for complete tear; Progress scapular retraction & gentle RTC strengthening as ROM improves; Vasopneumatic compression/ice +/- e-stim PRN for pain control   Consulted and Agree with Plan of Care Patient  Patient will benefit from skilled therapeutic intervention in order to improve the following deficits and impairments:  Decreased range of motion, Decreased strength, Impaired perceived functional ability, Impaired UE functional use, Pain, Increased edema  Visit Diagnosis: Acute pain of right shoulder  Stiffness of right shoulder, not elsewhere classified  Muscle weakness (generalized)     Problem List Patient Active Problem List   Diagnosis Date Noted  . Obesity 07/18/2015  . OSA (obstructive sleep apnea) 06/07/2013    Marry Guan, PT, MPT 05/13/2016, 6:17 PM  [facility] [address]  [address] [place], [place], [address] Phone: [phone]   Fax:  [phone]  Name: Kerry Reynolds MRN: [id number] Date of Birth: 1955-06-22

## 2016-05-14 NOTE — Telephone Encounter (Signed)
Started note in error

## 2016-05-15 ENCOUNTER — Ambulatory Visit: Payer: Worker's Compensation

## 2016-05-15 DIAGNOSIS — M25611 Stiffness of right shoulder, not elsewhere classified: Secondary | ICD-10-CM

## 2016-05-15 DIAGNOSIS — M25511 Pain in right shoulder: Secondary | ICD-10-CM

## 2016-05-15 DIAGNOSIS — M6281 Muscle weakness (generalized): Secondary | ICD-10-CM

## 2016-05-15 NOTE — Therapy (Signed)
[facility] Outpatient Rehabilitation [facility] [address]  [address] [place], [place], [address] Phone: [phone]   Fax:  [phone]  Physical Therapy Treatment  Patient Details  Name: Kerry Reynolds MRN: [id number] Date of Birth: Dec 23, 1955 Referring Provider: [name], MD  Encounter Date: 05/15/2016      PT End of Session - 05/15/16 1656    Visit Number 8   Number of Visits 23   Date for PT Re-Evaluation 06/05/16   Authorization Type UHC 23 visit limit   PT Start Time 1615   PT Stop Time 1710   PT Time Calculation (min) 55 min   Activity Tolerance Patient tolerated treatment well   Behavior During Therapy [facility] for tasks assessed/performed      Past Medical History:  Diagnosis Date  . Anxiety   . Depression   . Diabetes (HCC)    type II  . Heart murmur   . HTN (hypertension)   . Hyperlipidemia   . PONV (postoperative nausea and vomiting)   . Sleep apnea   . Tinnitus     Past Surgical History:  Procedure Laterality Date  . BACK SURGERY     lower back  . COLONOSCOPY    . HERNIA REPAIR     [employer]  . LACERATION REPAIR Right    arm  . SHOULDER ARTHROSCOPY WITH OPEN ROTATOR CUFF REPAIR AND DISTAL CLAVICLE ACROMINECTOMY Right 03/06/2016   Procedure: SHOULDER ARTHROSCOPY WITH SUBACROMIAL DECOMPRESSION, DISTAL CLAVICLE RESECTION, MINI-OPEN ROTATOR CUFF REPAIR, POSSIBLE [facility] PATCH;  Surgeon: [place] Batman, MD;  Location: [facility] OR;  Service: Orthopedics;  Laterality: Right;  Needs RNFA  . WISDOM TOOTH EXTRACTION      There were no vitals filed for this visit.      Subjective Assessment - 05/15/16 1614    Subjective Pt. noting he is able to sleep through the night without R shoulder pain now and reports he was only mildly sore following treatment.     Patient Stated Goals I need to be more active (get back on treadmill);  sleep all night long   Currently in Pain? Yes   Pain Score 2    Pain Location Shoulder   Pain Orientation  Right;Upper   Pain Descriptors / Indicators Aching            [facility] Adult PT Treatment/Exercise - 05/15/16 1630      Shoulder Exercises: Supine   Protraction Right;Weights;15 reps   Protraction Weight (lbs) 2#   Protraction Limitations Serratus punch    Other Supine Exercises R shoulder circles at 90 dg flexion 1# CW/CCW x 20 each     Shoulder Exercises: Prone   Retraction Right;10 reps   Retraction Limitations row with 5" hold   Extension Right;10 reps   Extension Limitations to neutral + scap retraction, 5" hold     Shoulder Exercises: Standing   Flexion Right;AAROM;10 reps   Flexion Limitations wall ladder     Shoulder Exercises: Pulleys   Flexion 2 minutes   ABduction 2 minutes   ABduction Limitations scaption     Shoulder Exercises: Therapy Ball   Flexion 10 reps   Flexion Limitations seated Pball roll-out with green (75 cm) Pball, 5" stretch at end flexion ROM     Shoulder Exercises: ROM/Strengthening   UBE (Upper Arm Bike) lvl 1.0 fwb/back 2' each   Rhythmic Stabilization, Supine R shoulder at 90 dg flexion 2x30'     Vasopneumatic   Number Minutes Vasopneumatic  10  minutes   Vasopnuematic Location  Shoulder  R   Vasopneumatic Pressure Medium   Vasopneumatic Temperature  coldest      Manual Therapy   Manual Therapy Joint mobilization;Soft tissue mobilization;Passive ROM   Joint Mobilization grade 2-3 inf & A/P glide R shoulder   Passive ROM R shoulder PROM all planes to pt tolerance           PT Short Term Goals - 05/01/16 1622      PT SHORT TERM GOAL #1   Title The patient will initiate knowledge of self care strategies to promote surgical healing (precautionary motion, positioning)   05/08/16   Status Achieved     PT SHORT TERM GOAL #2   Title The patient will report a 30% improvement in sleep   Status Achieved     PT SHORT TERM GOAL #3   Title Passive and active assisted  flexion/elevation right shoulder  to 140 degrees as needed to progress  to active ROM   Status On-going           PT Long Term Goals - 04/15/16 1701      PT LONG TERM GOAL #1   Title The patient will be independent in safe self progression of HEP needed for further improvements in ROM and strength   06/05/16   Status On-going     PT LONG TERM GOAL #2   Title The patient will report a 60% improvement in sleep and pain with usual home /work ADLs    Status On-going     PT LONG TERM GOAL #3   Title Right shoulder flexion/elevation to 120 degrees, external rotation to 50 degrees, internal rotation to L4 region behind back needed for grooming/dressing using right UE   Status On-going     PT LONG TERM GOAL #4   Title Right shoulder strength grossly 3+/5 to 4-/5  needed for light lifting and return to playing bass guitar   Status On-going     PT LONG TERM GOAL #5   Title FOTO functional outcome score improved from 56% limitation to 35% indicating improved function with less pain   Status On-going               Plan - 05/15/16 1657    Clinical Impression Statement Pt. reporting mild increase in pain following last treatment, which only lasted a short time.  Pt. tolerated progression of hold times with prone retraction/ext., increased reps with wall ladder flexion, and continued scapular stabilization with pain remaining around 4/10 throughout treatment.  Ice/compression to conclude treatment due to shoulder pain, which returned to baseline to end treatment.  Pt. will continue to benefit from further skilled therapy to restore R shoulder ROM and strength per protocol.     PT Treatment/Interventions ADLs/Self Care Home Management;Cryotherapy;Electrical Stimulation;Moist Heat;Therapeutic exercise;Patient/family education;Passive range of motion;Manual techniques;Taping;Vasopneumatic Device   PT Next Visit Plan Joint mobs/manual therapy & PROM right shoulder to restore full ROM; Progress AAROM per rotator cuff repair protocol for complete tear; Progress  scapular retraction & gentle RTC strengthening as ROM improves; Vasopneumatic compression/ice +/- e-stim PRN for pain control      Patient will benefit from skilled therapeutic intervention in order to improve the following deficits and impairments:  Decreased range of motion, Decreased strength, Impaired perceived functional ability, Impaired UE functional use, Pain, Increased edema  Visit Diagnosis: Acute pain of right shoulder  Stiffness of right shoulder, not elsewhere classified  Muscle weakness (generalized)     Problem List  Patient Active Problem List   Diagnosis Date Noted  . Obesity 07/18/2015  . OSA (obstructive sleep apnea) 06/07/2013    Kermit BaloMicah Perkins Molina, PTA 05/15/16 6:17 PM  [facility] [place] [address]  [address] [place], [place], [address] Phone: [phone]   Fax:  [phone]  Name: Kerry Reynolds MRN: [id number] Date of Birth: May 30, 1955

## 2016-05-20 ENCOUNTER — Ambulatory Visit: Payer: Worker's Compensation

## 2016-05-20 DIAGNOSIS — M6281 Muscle weakness (generalized): Secondary | ICD-10-CM

## 2016-05-20 DIAGNOSIS — M25511 Pain in right shoulder: Secondary | ICD-10-CM

## 2016-05-20 DIAGNOSIS — M25611 Stiffness of right shoulder, not elsewhere classified: Secondary | ICD-10-CM

## 2016-05-20 NOTE — Therapy (Signed)
[facility] Outpatient Rehabilitation [facility] [address]  [address] [place], [place], [address] Phone: [phone]   Fax:  [phone]  Physical Therapy Treatment  Patient Details  Name: Kerry Reynolds MRN: [id number] Date of Birth: [birth year] Referring Provider: [name], MD   Encounter Date: 05/20/2016      PT End of Session - 05/20/16 1621    Visit Number 9   Number of Visits 23   Date for PT Re-Evaluation 06/05/16   Authorization Type U[employer] 23 visit limit   PT Start Time 1617   PT Stop Time 1702   PT Time Calculation (min) 45 min   Activity Tolerance Patient tolerated treatment well   Behavior During Therapy [facility] for tasks assessed/performed      Past Medical History:  Diagnosis Date  . Anxiety   . Depression   . Diabetes (HCC)    type II  . Heart murmur   . HTN (hypertension)   . Hyperlipidemia   . PONV (postoperative nausea and vomiting)   . Sleep apnea   . Tinnitus     Past Surgical History:  Procedure Laterality Date  . BACK SURGERY     lower back  . COLONOSCOPY    . HERNIA REPAIR     [employer]  . LACERATION REPAIR Right    arm  . SHOULDER ARTHROSCOPY WITH OPEN ROTATOR CUFF REPAIR AND DISTAL CLAVICLE ACROMINECTOMY Right 03/06/2016   Procedure: SHOULDER ARTHROSCOPY WITH SUBACROMIAL DECOMPRESSION, DISTAL CLAVICLE RESECTION, MINI-OPEN ROTATOR CUFF REPAIR, POSSIBLE [facility] PATCH;  Surgeon: Valeria Batman, MD;  Location: [facility] OR;  Service: Orthopedics;  Laterality: Right;  Needs RNFA  . WISDOM TOOTH EXTRACTION      There were no vitals filed for this visit.      Subjective Assessment - 05/20/16 1619    Subjective Pt. noting he has been performing HEP daily and still has rising pain with this however pain quickly returns to baseline following this.     Patient Stated Goals I need to be more active (get back on treadmill);  sleep all night long   Currently in Pain? No/denies   Pain Score 0-No pain   Multiple Pain Sites No             [facility] PT Assessment - 05/20/16 1625      Assessment   Medical Diagnosis Right rotator cuff repair   Referring Provider [name], MD    Onset Date/Surgical Date 03/06/16   Hand Dominance Right   Next MD Visit 2.27.18           [facility] Adult PT Treatment/Exercise - 05/20/16 1636      Shoulder Exercises: Supine   Protraction Right;Weights;15 reps   Protraction Weight (lbs) 3#   Protraction Limitations Serratus punch    Other Supine Exercises R shoulder circles at 90 dg flexion 2# CW/CCW x 20 each     Shoulder Exercises: Prone   Retraction Right;10 reps   Retraction Limitations row with 5" hold   Extension Right;10 reps   Extension Limitations to neutral + scap retraction, 5" hold     Shoulder Exercises: Standing   Flexion Right;AAROM;10 reps   Flexion Limitations wall ladder   ABduction Right;AAROM;10 reps  scaption   ABduction Limitations wall ladder    Extension AROM   Row Both;Theraband;15 reps   Theraband Level (Shoulder Row) Level 2 (Red)   Row Limitations 5" hold   Theraband Level (Shoulder Retraction) Level 2 (Red)   Retraction Weight (  lbs) + shoulder extension to neutral, 5" hold     Shoulder Exercises: Pulleys   Flexion 2 minutes   ABduction 2 minutes   ABduction Limitations scaption     Manual Therapy   Manual Therapy Passive ROM   Passive ROM R shoulder PROM all planes to pt tolerance            PT Short Term Goals - 05/01/16 1622      PT SHORT TERM GOAL #1   Title The patient will initiate knowledge of self care strategies to promote surgical healing (precautionary motion, positioning)   05/08/16   Status Achieved     PT SHORT TERM GOAL #2   Title The patient will report a 30% improvement in sleep   Status Achieved     PT SHORT TERM GOAL #3   Title Passive and active assisted  flexion/elevation right shoulder  to 140 degrees as needed to progress to active ROM   Status On-going           PT Long Term Goals - 04/15/16  1701      PT LONG TERM GOAL #1   Title The patient will be independent in safe self progression of HEP needed for further improvements in ROM and strength   06/05/16   Status On-going     PT LONG TERM GOAL #2   Title The patient will report a 60% improvement in sleep and pain with usual home /work ADLs    Status On-going     PT LONG TERM GOAL #3   Title Right shoulder flexion/elevation to 120 degrees, external rotation to 50 degrees, internal rotation to L4 region behind back needed for grooming/dressing using right UE   Status On-going     PT LONG TERM GOAL #4   Title Right shoulder strength grossly 3+/5 to 4-/5  needed for light lifting and return to playing bass guitar   Status On-going     PT LONG TERM GOAL #5   Title FOTO functional outcome score improved from 56% limitation to 35% indicating improved function with less pain   Status On-going               Plan - 05/20/16 1709    Clinical Impression Statement Pt. R shoulder pain continues to be well controlled for pt. at home and in therapy.  Mild progression in resistance to red TB with standing row and row/extension today.  Mild resistance increase with prone row and row/ext. to 1#.  Pt. tolerating all progression well and reporting less discomfort with wall ladder and pulleys today.  Ice/compression not performed to end therapy today due to good pain control and pt. preference.  Pt. progressing well and will continue to benefit from further skilled therapy to maximize function.      PT Treatment/Interventions ADLs/Self Care Home Management;Cryotherapy;Electrical Stimulation;Moist Heat;Therapeutic exercise;Patient/family education;Passive range of motion;Manual techniques;Taping;Vasopneumatic Device   PT Next Visit Plan 10th VISIT FOTO; Joint mobs/manual therapy & PROM right shoulder to restore full ROM; Progress AAROM per rotator cuff repair protocol for complete tear; Progress scapular retraction & gentle RTC strengthening as  ROM improves; Vasopneumatic compression/ice +/- e-stim PRN for pain control      Patient will benefit from skilled therapeutic intervention in order to improve the following deficits and impairments:  Decreased range of motion, Decreased strength, Impaired perceived functional ability, Impaired UE functional use, Pain, Increased edema  Visit Diagnosis: Acute pain of right shoulder  Stiffness of right shoulder, not elsewhere  classified  Muscle weakness (generalized)     Problem List Patient Active Problem List   Diagnosis Date Noted  . Obesity 07/18/2015  . OSA (obstructive sleep apnea) 06/07/2013    Kermit BaloMicah Roxene Alviar, PTA 05/20/16 5:23 PM  [facility] [place] [facility] [address]  [address] [place], [place], [address] Phone: [phone]   Fax:  [phone]  Name: Kerry Reynolds MRN: [id number] Date of Birth: [birth year]

## 2016-05-22 ENCOUNTER — Ambulatory Visit: Payer: Worker's Compensation

## 2016-05-22 DIAGNOSIS — M25511 Pain in right shoulder: Secondary | ICD-10-CM

## 2016-05-22 DIAGNOSIS — M25611 Stiffness of right shoulder, not elsewhere classified: Secondary | ICD-10-CM

## 2016-05-22 DIAGNOSIS — M6281 Muscle weakness (generalized): Secondary | ICD-10-CM

## 2016-05-22 NOTE — Therapy (Signed)
[facility] [address]  [place], [place], [address] Phone: [phone]   Fax:  [phone]  Physical Therapy Treatment  Patient Details  Name: Kerry Reynolds MRN: [id number] Date of Birth: 04-10-1955 Referring Provider: Joni Fears, MD   Encounter Date: 05/22/2016      PT End of Session - 05/22/16 1620    Visit Number 10   Number of Visits 23   Date for PT Re-Evaluation 06/05/16   Authorization Type UHC 23 visit limit   PT Start Time 1615   PT Stop Time 1705   PT Time Calculation (min) 50 min   Activity Tolerance Patient tolerated treatment well   Behavior During Therapy [facility] for tasks assessed/performed      Past Medical History:  Diagnosis Date  . Anxiety   . Depression   . Diabetes ([place])    type II  . Heart murmur   . HTN (hypertension)   . Hyperlipidemia   . PONV (postoperative nausea and vomiting)   . Sleep apnea   . Tinnitus     Past Surgical History:  Procedure Laterality Date  . BACK SURGERY     lower back  . COLONOSCOPY    . [place] Right    arm  . SHOULDER ARTHROSCOPY WITH OPEN ROTATOR CUFF REPAIR AND DISTAL CLAVICLE ACROMINECTOMY Right 03/06/2016   Procedure: SHOULDER ARTHROSCOPY WITH SUBACROMIAL DECOMPRESSION, DISTAL CLAVICLE RESECTION, MINI-OPEN ROTATOR CUFF REPAIR, POSSIBLE [facility] PATCH;  Surgeon: Garald Balding, MD;  Location: [place];  Service: Orthopedics;  Laterality: Right;  Needs RNFA  . WISDOM TOOTH EXTRACTION      There were no vitals filed for this visit.      Subjective Assessment - 05/22/16 1618    Subjective Mild soreness following last treatment.     Patient Stated Goals I need to be more active (get back on treadmill);  sleep all night long   Currently in Pain? Yes   Pain Score 1    Pain Location Shoulder   Pain Orientation Right;Upper   Pain Descriptors / Indicators Dull;Aching   Pain Type Surgical pain   Pain  Radiating Towards n/a   Pain Onset More than a month ago   Pain Frequency Intermittent   Aggravating Factors  Reaching, twisting, putting on belt   Pain Relieving Factors rest    Effect of Pain on Daily Activities limits use of R UE    Multiple Pain Sites No            [facility] PT Assessment - 05/22/16 1639      PROM   Right Shoulder Flexion 142 Degrees  AAROM with wand           [facility] Adult PT Treatment/Exercise - 05/22/16 1623      Shoulder Exercises: Supine   Protraction Right;Weights;20 reps   Protraction Weight (lbs) 3#   Protraction Limitations Serratus punch    Flexion Right;AAROM;20 reps;Weights   Shoulder Flexion Weight (lbs) 3# cuff wt on cane   Flexion Limitations wand   Other Supine Exercises R shoulder circles at 90 dg flexion 2# CW/CCW x 20 each (medium pizza circles)     Shoulder Exercises: Prone   Retraction Right;15 reps;AROM;Strengthening;Weights   Retraction Weight (lbs) 1   Retraction Limitations row with 5" hold   Extension Right;15 reps;AROM;Strengthening;Weights   Extension Weight (lbs) 1   Extension Limitations to neutral + scap retraction, 5"  hold     Shoulder Exercises: Standing   Flexion Right;AAROM;10 reps   Flexion Limitations wall ladder   ABduction Right;AAROM;10 reps  scaption   ABduction Limitations wall ladder    Row Both;Theraband;15 reps   Theraband Level (Shoulder Row) Level 2 (Red)   Row Limitations 5" hold   Theraband Level (Shoulder Retraction) Level 2 (Red)   Retraction Weight (lbs) + shoulder extension to neutral, 5" hold     Shoulder Exercises: Pulleys   Flexion 2 minutes   ABduction 2 minutes   ABduction Limitations scaption     Shoulder Exercises: ROM/Strengthening   UBE (Upper Arm Bike) lvl 1.5 fwb/back 2' each     Manual Therapy   Manual Therapy Passive ROM   Joint Mobilization grade 2-3 inf & A/P glide R shoulder   Passive ROM R shoulder PROM all planes to pt tolerance with gentle holds; pt. confirming  appropriateness of stretch with minor increase in pain                 PT Education - 05/22/16 1713    Education Details row with red TB issued to pt., row/extension with red TB    Person(s) Educated Patient   Methods Explanation;Demonstration;Handout   Comprehension Verbalized understanding;Returned demonstration          PT Short Term Goals - 05/22/16 1636      PT SHORT TERM GOAL #1   Title The patient will initiate knowledge of self care strategies to promote surgical healing (precautionary motion, positioning)   05/08/16   Status Achieved     PT SHORT TERM GOAL #2   Title The patient will report a 30% improvement in sleep   Status Achieved     PT SHORT TERM GOAL #3   Title Passive and active assisted  flexion/elevation right shoulder  to 140 degrees as needed to progress to active ROM   Status Achieved           PT Long Term Goals - 04/15/16 1701      PT LONG TERM GOAL #1   Title The patient will be independent in safe self progression of HEP needed for further improvements in ROM and strength   06/05/16   Status On-going     PT LONG TERM GOAL #2   Title The patient will report a 60% improvement in sleep and pain with usual home /work ADLs    Status On-going     PT LONG TERM GOAL #3   Title Right shoulder flexion/elevation to 120 degrees, external rotation to 50 degrees, internal rotation to L4 region behind back needed for grooming/dressing using right UE   Status On-going     PT LONG TERM GOAL #4   Title Right shoulder strength grossly 3+/5 to 4-/5  needed for light lifting and return to playing bass guitar   Status On-going     PT LONG TERM GOAL #5   Title FOTO functional outcome score improved from 56% limitation to 35% indicating improved function with less pain   Status On-going               Plan - 05/22/16 1622    Clinical Impression Statement Pt. R shoulder ROM continues to improve able to demo 142dg flexion with AAROM wand today.  Pt.  has now met all STG's.  Mild progression in all AAROM today and tolerated very well.  Pt. pain levels remaining low with therex today with pt. now at end of wk 11  since surgery.  Pt. able to demo row and row/extension with red TB with good scap. retraction.  These activities added to HEP.  Pt. will continue to benefit from further skilled therapy to maximize function.     PT Treatment/Interventions ADLs/Self Care Home Management;Cryotherapy;Electrical Stimulation;Moist Heat;Therapeutic exercise;Patient/family education;Passive range of motion;Manual techniques;Taping;Vasopneumatic Device   PT Next Visit Plan Joint mobs/manual therapy & PROM right shoulder to restore full ROM; Progress AAROM per rotator cuff repair protocol for complete tear; Progress scapular retraction & gentle RTC strengthening as ROM improves; Vasopneumatic compression/ice +/- e-stim PRN for pain control      Patient will benefit from skilled therapeutic intervention in order to improve the following deficits and impairments:  Decreased range of motion, Decreased strength, Impaired perceived functional ability, Impaired UE functional use, Pain, Increased edema  Visit Diagnosis: Acute pain of right shoulder  Stiffness of right shoulder, not elsewhere classified  Muscle weakness (generalized)     Problem List Patient Active Problem List   Diagnosis Date Noted  . Obesity 07/18/2015  . OSA (obstructive sleep apnea) 06/07/2013    Bess Harvest, PTA 05/22/16 5:24 PM   [facility] [address]  [place], [place], [address] Phone: [phone]   Fax:  [phone]  Name: DIEZEL MAZUR MRN: [id number] Date of Birth: 03/28/1956

## 2016-05-27 ENCOUNTER — Ambulatory Visit: Payer: Worker's Compensation | Admitting: Physical Therapy

## 2016-05-27 DIAGNOSIS — M6281 Muscle weakness (generalized): Secondary | ICD-10-CM

## 2016-05-27 DIAGNOSIS — M25511 Pain in right shoulder: Secondary | ICD-10-CM | POA: Diagnosis not present

## 2016-05-27 DIAGNOSIS — M25611 Stiffness of right shoulder, not elsewhere classified: Secondary | ICD-10-CM

## 2016-05-27 NOTE — Therapy (Signed)
[facility] Outpatient Rehabilitation [facility] [address]  [address] [place], [place], [address] Phone: [phone]   Fax:  [phone]  Physical Therapy Treatment  Patient Details  Name: Kerry Reynolds MRN: [id number] Date of Birth: 12/27/1955 Referring Provider: [name], MD   Encounter Date: 05/27/2016      PT End of Session - 05/27/16 1702    Visit Number 11   Number of Visits 23   Date for PT Re-Evaluation 06/05/16   Authorization Type UHC 23 visit limit   PT Start Time 1615   PT Stop Time 1707   PT Time Calculation (min) 52 min   Activity Tolerance Patient tolerated treatment well;No increased pain   Behavior During Therapy WFL for tasks assessed/performed      Past Medical History:  Diagnosis Date  . Anxiety   . Depression   . Diabetes (HCC)    type II  . Heart murmur   . HTN (hypertension)   . Hyperlipidemia   . PONV (postoperative nausea and vomiting)   . Sleep apnea   . Tinnitus     Past Surgical History:  Procedure Laterality Date  . BACK SURGERY     lower back  . COLONOSCOPY    . [place]ERNIA REPAIR     [employer]  . LACERATION REPAIR Right    arm  . SHOULDER ARTHROSCOPY WITH OPEN ROTATOR CUFF REPAIR AND DISTAL CLAVICLE ACROMINECTOMY Right 03/06/2016   Procedure: SHOULDER ARTHROSCOPY WITH SUBACROMIAL DECOMPRESSION, DISTAL CLAVICLE RESECTION, MINI-OPEN ROTATOR CUFF REPAIR, POSSIBLE [facility] PATCH;  Surgeon: Valeria Batman, MD;  [facility];  Service: Orthopedics;  Laterality: Right;  Needs RNFA  . WISDOM TOOTH EXTRACTION      There were no vitals filed for this visit.      Subjective Assessment - 05/27/16 1619    Subjective Pt reports he has been pretty good since the last visit with only a few mild aches and pains.    Patient Stated Goals I need to be more active (get back on treadmill);  sleep all night long   Currently in Pain? Yes   Pain Score 1    Pain Location Shoulder   Pain Orientation Right;Upper   Pain  Descriptors / Indicators Dull;Aching   Pain Type Surgical pain   Pain Onset More than a month ago   Pain Frequency Intermittent   Aggravating Factors  Reaching, twisting, putting on belt   Pain Relieving Factors Rest   Effect of Pain on Daily Activities Limits use of R UE                         [facility] Adult PT Treatment/Exercise - 05/27/16 0001      Shoulder Exercises: Supine   Horizontal ABduction Both;10 reps;Theraband   Theraband Level (Shoulder Horizontal ABduction) Level 1 (Yellow)   Horizontal ABduction Limitations 3" holds; ROM to pt tolerance     Shoulder Exercises: Standing   External Rotation Right;10 reps;Theraband  5" holds + towel roll    Theraband Level (Shoulder External Rotation) Level 1 (Yellow)   Internal Rotation Right;10 reps;Theraband  5" holds + towel roll under arm   Theraband Level (Shoulder Internal Rotation) Level 1 (Yellow)   Row Both;10 reps   Theraband Level (Shoulder Row) Level 3 (Green)   Row Limitations 5" holds   Retraction Both;10 reps   Theraband Level (Shoulder Retraction) Level 3 (Green)   Retraction Weight (lbs) + shoulder extension to neutral,  5" hold   Other Standing Exercises ball circles on wall CW/CCW; 10 reps  Pt reported increased pain following exercise.   Other Standing Exercises Scaption AROM against doorway; 10 reps; 5" holds     Shoulder Exercises: Pulleys   Flexion 2 minutes   ABduction 2 minutes   ABduction Limitations scaption     Shoulder Exercises: ROM/Strengthening   UBE (Upper Arm Bike) lvl 1.5 fwb/back 3' each     Vasopneumatic   Number Minutes Vasopneumatic  10 minutes   Vasopnuematic Location  Shoulder  R   Vasopneumatic Pressure Medium   Vasopneumatic Temperature  Coldest Temperature                  PT Short Term Goals - 05/22/16 1636      PT SHORT TERM GOAL #1   Title The patient will initiate knowledge of self care strategies to promote surgical healing (precautionary  motion, positioning)   05/08/16   Status Achieved     PT SHORT TERM GOAL #2   Title The patient will report a 30% improvement in sleep   Status Achieved     PT SHORT TERM GOAL #3   Title Passive and active assisted  flexion/elevation right shoulder  to 140 degrees as needed to progress to active ROM   Status Achieved           PT Long Term Goals - 04/15/16 1701      PT LONG TERM GOAL #1   Title The patient will be independent in safe self progression of HEP needed for further improvements in ROM and strength   06/05/16   Status On-going     PT LONG TERM GOAL #2   Title The patient will report a 60% improvement in sleep and pain with usual home /work ADLs    Status On-going     PT LONG TERM GOAL #3   Title Right shoulder flexion/elevation to 120 degrees, external rotation to 50 degrees, internal rotation to L4 region behind back needed for grooming/dressing using right UE   Status On-going     PT LONG TERM GOAL #4   Title Right shoulder strength grossly 3+/5 to 4-/5  needed for light lifting and return to playing bass guitar   Status On-going     PT LONG TERM GOAL #5   Title FOTO functional outcome score improved from 56% limitation to 35% indicating improved function with less pain   Status On-going               Plan - 05/27/16 1703    Clinical Impression Statement Pt is reporting he is just have some mild aches and pains in his shoulder every now and then. He was able to tolerate progressions in R scapular & UE strengthening & ROM Exercises today with no increase in pain following treatment. He was reporting pain after ball circles on the wall but that pain decreased. Ice was continued today to help decrease any soreness he is having following treatments. He will continue to benefit from skilled therapy to reduce R shoulder pain, increasing R UE & scapular strength, & improving R shoulder ROM.    Rehab Potential Good   Clinical Impairments Affecting Rehab Potential  rotator cuff repair for complete tear 03/06/16   PT Treatment/Interventions ADLs/Self Care Home Management;Cryotherapy;Electrical Stimulation;Moist Heat;Therapeutic exercise;Patient/family education;Passive range of motion;Manual techniques;Taping;Vasopneumatic Device   PT Next Visit Plan Joint mobs/manual therapy & PROM right shoulder to restore full ROM; Progress AAROM & AROM  per rotator cuff repair protocol for complete tear; Progress scapular retraction & RTC strengthening as ROM improves; Vasopneumatic compression/ice +/- e-stim PRN for pain control   Consulted and Agree with Plan of Care Patient      Patient will benefit from skilled therapeutic intervention in order to improve the following deficits and impairments:  Decreased range of motion, Decreased strength, Impaired perceived functional ability, Impaired UE functional use, Pain, Increased edema  Visit Diagnosis: Acute pain of right shoulder  Stiffness of right shoulder, not elsewhere classified  Muscle weakness (generalized)     Problem List Patient Active Problem List   Diagnosis Date Noted  . Obesity 07/18/2015  . OSA (obstructive sleep apnea) 06/07/2013    Katheran Jamesaylor Brandey Vandalen, SPT 05/27/2016, 5:06 PM  [facility] [address]  [address] [place], [place], [address] Phone: [phone]   Fax:  [phone]  Name: Kerry Reynolds MRN: [id number] Date of Birth: 09-25-1955

## 2016-05-29 ENCOUNTER — Ambulatory Visit: Payer: Worker's Compensation

## 2016-05-29 DIAGNOSIS — M25511 Pain in right shoulder: Secondary | ICD-10-CM

## 2016-05-29 DIAGNOSIS — M25611 Stiffness of right shoulder, not elsewhere classified: Secondary | ICD-10-CM

## 2016-05-29 DIAGNOSIS — M6281 Muscle weakness (generalized): Secondary | ICD-10-CM

## 2016-05-29 NOTE — Therapy (Signed)
[facility] [address]  [address] [place], [place], [address] Phone: [phone]   Fax:  [phone]  Physical Therapy Treatment  Patient Details  Name: Kerry Reynolds L Crabbe MRN: [id number] Date of Birth: 09-05-1955 Referring Provider: Norlene CampbellPeter Whitfield, MD   Encounter Date: 05/29/2016      PT End of Session - 05/29/16 1618    Visit Number 12   Number of Visits 23   Date for PT Re-Evaluation 06/05/16   Authorization Type [employer] 23 visit limit   PT Start Time 1613   PT Stop Time 1707   PT Time Calculation (min) 54 min   Activity Tolerance Patient tolerated treatment well;No increased pain   Behavior During Therapy WFL for tasks assessed/performed      Past Medical History:  Diagnosis Date  . Anxiety   . Depression   . Diabetes (HCC)    type II  . Heart murmur   . HTN (hypertension)   . Hyperlipidemia   . PONV (postoperative nausea and vomiting)   . Sleep apnea   . Tinnitus     Past Surgical History:  Procedure Laterality Date  . BACK SURGERY     lower back  . COLONOSCOPY    . HERNIA REPAIR     [employer]  . LACERATION REPAIR Right    arm  . SHOULDER ARTHROSCOPY WITH OPEN ROTATOR CUFF REPAIR AND DISTAL CLAVICLE ACROMINECTOMY Right 03/06/2016   Procedure: SHOULDER ARTHROSCOPY WITH SUBACROMIAL DECOMPRESSION, DISTAL CLAVICLE RESECTION, MINI-OPEN ROTATOR CUFF REPAIR, POSSIBLE [facility];  Surgeon: Valeria BatmanPeter W Whitfield, MD;  Location: [facility] OR;  Service: Orthopedics;  Laterality: Right;  Needs RNFA  . WISDOM TOOTH EXTRACTION      There were no vitals filed for this visit.      Subjective Assessment - 05/29/16 1616    Subjective Pt. reporting increased pain following last treatment however this was controlled with Aleve, with no pain the following day.     Patient Stated Goals I need to be more active (get back on treadmill);  sleep all night long   Currently in Pain? No/denies   Pain Score --   Multiple Pain Sites No             [facility] PT Assessment - 05/29/16 1709      PROM   Right Shoulder Flexion 142 Degrees   Right Shoulder ABduction 112 Degrees   Right Shoulder Internal Rotation 85 Degrees   Right Shoulder External Rotation 65 Degrees            [facility] Adult PT Treatment/Exercise - 05/29/16 1622      Shoulder Exercises: Supine   Protraction Right;Weights;15 reps   Protraction Weight (lbs) 4#   Other Supine Exercises Supine R shoulder ABC's with red med ball (1000Gr) in 90dg flexion x 2 reps      Shoulder Exercises: Standing   External Rotation Right;Theraband;15 reps   Theraband Level (Shoulder External Rotation) Level 1 (Yellow)   Internal Rotation Right;Theraband;15 reps   Theraband Level (Shoulder Internal Rotation) Level 1 (Yellow)   Flexion Right;AAROM;10 reps   Flexion Limitations wall ladder   ABduction Right;AAROM;10 reps   ABduction Limitations wall ladder    Row Both;5 reps   Theraband Level (Shoulder Row) Level 3 (Green)  pain free and good technique thus green TB sent for HEP    Row Limitations 5" holds   Retraction Both;5 reps  Pain free and good technique thus green TB added to HEP  Theraband Level (Shoulder Retraction) Level 3 (Green)   Retraction Weight (lbs) + shoulder extension to neutral, 5" hold   Other Standing Exercises ball circles on wall CW/CCW; 10 reps   Other Standing Exercises Wall slide side-side stepping with hands (no resistance)     Shoulder Exercises: Pulleys   Flexion 2 minutes   ABduction 2 minutes   ABduction Limitations scaption     Vasopneumatic   Number Minutes Vasopneumatic  --   Vasopnuematic Location  --     Manual Therapy   Manual Therapy Passive ROM;Joint mobilization   Joint Mobilization grade 2-3 inf & A/P glide R shoulder   Passive ROM R shoulder PROM all planes to pt tolerance with gentle holds                 PT Education - 05/29/16 1632    Education provided Yes   Education Details Green TB issue to pt. for  row, and row extension   Person(s) Educated Patient   Methods Explanation;Demonstration;Handout   Comprehension Verbalized understanding;Returned demonstration          PT Short Term Goals - 05/22/16 1636      PT SHORT TERM GOAL #1   Title The patient will initiate knowledge of self care strategies to promote surgical healing (precautionary motion, positioning)   05/08/16   Status Achieved     PT SHORT TERM GOAL #2   Title The patient will report a 30% improvement in sleep   Status Achieved     PT SHORT TERM GOAL #3   Title Passive and active assisted  flexion/elevation right shoulder  to 140 degrees as needed to progress to active ROM   Status Achieved           PT Long Term Goals - 04/15/16 1701      PT LONG TERM GOAL #1   Title The patient will be independent in safe self progression of HEP needed for further improvements in ROM and strength   06/05/16   Status On-going     PT LONG TERM GOAL #2   Title The patient will report a 60% improvement in sleep and pain with usual home /work ADLs    Status On-going     PT LONG TERM GOAL #3   Title Right shoulder flexion/elevation to 120 degrees, external rotation to 50 degrees, internal rotation to L4 region behind back needed for grooming/dressing using right UE   Status On-going     PT LONG TERM GOAL #4   Title Right shoulder strength grossly 3+/5 to 4-/5  needed for light lifting and return to playing bass guitar   Status On-going     PT LONG TERM GOAL #5   Title FOTO functional outcome score improved from 56% limitation to 35% indicating improved function with less pain   Status On-going               Plan - 05/29/16 1619    Clinical Impression Statement Considering late start to therapy, pt. progressing well at this point with less pain and steady progress to more advanced AAROM activities.  Pt. demonstrating improved PROM today R shoulder flex 141 dg, abd 112 dg, IR 85 dg ER 65 dg.  Less pain with standing wall  ball-roll today and tolerated addition of side-side wall walk at 90 dg flexion with only mild pain.  Yellow TB IR/ER progressed today with green TB sent home with pt. for row, row/extension.  Following PROM measurements, pt. with  pain returning to baseline thus pt. declined ice to end treatment.  Pt. will continue to benefit from further skilled therapy to improve R shoulder ROM, strength, and functional capacity.     PT Treatment/Interventions ADLs/Self Care Home Management;Cryotherapy;Electrical Stimulation;Moist Heat;Therapeutic exercise;Patient/family education;Passive range of motion;Manual techniques;Taping;Vasopneumatic Device   PT Next Visit Plan Joint mobs/manual therapy & PROM right shoulder to restore full ROM; Progress AAROM & AROM per rotator cuff repair protocol for complete tear; Progress scapular retraction & RTC strengthening as ROM improves; Vasopneumatic compression/ice +/- e-stim PRN for pain control      Patient will benefit from skilled therapeutic intervention in order to improve the following deficits and impairments:  Decreased range of motion, Decreased strength, Impaired perceived functional ability, Impaired UE functional use, Pain, Increased edema  Visit Diagnosis: Acute pain of right shoulder  Stiffness of right shoulder, not elsewhere classified  Muscle weakness (generalized)     Problem List Patient Active Problem List   Diagnosis Date Noted  . Obesity 07/18/2015  . OSA (obstructive sleep apnea) 06/07/2013    Kermit Balo, PTA 05/29/16 6:51 PM ' [facility] [address]  [address] [place], [place], [address] Phone: [phone]   Fax:  [phone]  Name: OCTAVIAN GODEK MRN: [id number] Date of Birth: January 15, 1956

## 2016-06-03 ENCOUNTER — Ambulatory Visit: Payer: Worker's Compensation

## 2016-06-03 DIAGNOSIS — M25511 Pain in right shoulder: Secondary | ICD-10-CM | POA: Diagnosis not present

## 2016-06-03 DIAGNOSIS — M25611 Stiffness of right shoulder, not elsewhere classified: Secondary | ICD-10-CM

## 2016-06-03 DIAGNOSIS — M6281 Muscle weakness (generalized): Secondary | ICD-10-CM

## 2016-06-03 NOTE — Therapy (Signed)
Dictation #1 [id number]  [id number] [facility] [address]  [address] [place], [place], [address] Phone: [phone]   Fax:  [phone]  Physical Therapy Treatment  Patient Details  Name: Kerry Reynolds MRN: [id number] Date of Birth: 1955-06-14 Referring Provider: [name], MD   Encounter Date: 06/03/2016      PT End of Session - 06/03/16 1614    Visit Number 13   Number of Visits 23   Date for PT Re-Evaluation 06/05/16   Authorization Type UHC 23 visit limit   PT Start Time 1615   PT Stop Time 1700   PT Time Calculation (min) 45 min   Activity Tolerance Patient tolerated treatment well;No increased pain   Behavior During Therapy WFL for tasks assessed/performed      Past Medical History:  Diagnosis Date  . Anxiety   . Depression   . Diabetes (HCC)    type II  . Heart murmur   . HTN (hypertension)   . Hyperlipidemia   . PONV (postoperative nausea and vomiting)   . Sleep apnea   . Tinnitus     Past Surgical History:  Procedure Laterality Date  . BACK SURGERY     lower back  . COLONOSCOPY    . HERNIA REPAIR     [employer]  . LACERATION REPAIR Right    arm  . SHOULDER ARTHROSCOPY WITH OPEN ROTATOR CUFF REPAIR AND DISTAL CLAVICLE ACROMINECTOMY Right 03/06/2016   Procedure: SHOULDER ARTHROSCOPY WITH SUBACROMIAL DECOMPRESSION, DISTAL CLAVICLE RESECTION, MINI-OPEN ROTATOR CUFF REPAIR, POSSIBLE [facility] PATCH;  Surgeon: Valeria Batman, MD;  Location: [facility] OR;  Service: Orthopedics;  Laterality: Right;  Needs RNFA  . WISDOM TOOTH EXTRACTION      There were no vitals filed for this visit.      Subjective Assessment - 06/03/16 1620    Subjective Pt. reporting being more active with R shoulder however has had less pain.     Patient Stated Goals I need to be more active (get back on treadmill);  sleep all night long   Currently in Pain? Yes   Pain Score --  .5   Pain Location Shoulder   Pain  Orientation Right;Upper   Pain Descriptors / Indicators Dull;Aching  Occasional sharp twinge with movement    Pain Type Surgical pain   Pain Onset More than a month ago   Pain Frequency Intermittent   Aggravating Factors  shoulder blade squeeze    Pain Relieving Factors rest    Effect of Pain on Daily Activities limits use of R UE    Multiple Pain Sites No            [facility] PT Assessment - 06/03/16 1627      Assessment   Medical Diagnosis Right rotator cuff repair   Next MD Visit 3.5.18  pt. remembering 2.27.18 appointment however actually 3.5.18              [facility] Adult PT Treatment/Exercise - 06/03/16 1632      Shoulder Exercises: Supine   Protraction Right;Weights;20 reps   Protraction Weight (lbs) 4#   Other Supine Exercises Supine R shoulder ABC's with red med ball (1000Gr) in 90dg flexion x 2 reps      Shoulder Exercises: Seated   Flexion AROM;Strengthening;10 reps;Right;Left   Flexion Limitations with mirror feedback to prevent scap. elevation; pt. with good correction   Abduction AROM;10 reps;Right;Left;Strengthening   ABduction Limitations scaption; mirror feedback to prevent scap. elevation; pt.  with good correction     Shoulder Exercises: Prone   Retraction Right;15 reps;AROM;Strengthening;Weights   Retraction Weight (lbs) 2   Retraction Limitations row with 5" hold   Extension Right;15 reps;AROM;Strengthening;Weights   Extension Weight (lbs) 2   Extension Limitations to neutral + scap retraction, 5" hold   Horizontal ABduction 1 AROM;Strengthening;Right;12 reps   Horizontal ABduction 2 AROM;10 reps;Strengthening;Right   Theraband Level (Shoulder Horizontal ABduction 2) --  just reporting weakness pain free     Shoulder Exercises: Standing   Horizontal ABduction AAROM;Strengthening;Right;Left;Theraband   Theraband Level (Shoulder Horizontal ABduction) Level 1 (Yellow)  yellow looped TB around wrists   Horizontal ABduction Limitations side<>side  wall walk 2 x 10 ft    External Rotation Right;Theraband;20 reps   Theraband Level (Shoulder External Rotation) Level 1 (Yellow)   Internal Rotation Right;Theraband;20 reps   Theraband Level (Shoulder Internal Rotation) Level 1 (Yellow)   Flexion --   Other Standing Exercises ball circles on wall CW/CCW; 20 reps   Other Standing Exercises Scaption, flexion AROM against doorway 5" x 10   2/10 pain with lowering      Shoulder Exercises: ROM/Strengthening   UBE (Upper Arm Bike) lvl 2.5 fwb/back 4' each            PT Education - 06/03/16 1827    Education provided Yes   Education Details shoulder IR, ER with yellow TB issued to pt.   Person(s) Educated Patient   Methods Explanation;Demonstration;Handout;Verbal cues   Comprehension Verbalized understanding;Returned demonstration;Verbal cues required;Need further instruction          PT Short Term Goals - 05/22/16 1636      PT SHORT TERM GOAL #1   Title The patient will initiate knowledge of self care strategies to promote surgical healing (precautionary motion, positioning)   05/08/16   Status Achieved     PT SHORT TERM GOAL #2   Title The patient will report a 30% improvement in sleep   Status Achieved     PT SHORT TERM GOAL #3   Title Passive and active assisted  flexion/elevation right shoulder  to 140 degrees as needed to progress to active ROM   Status Achieved           PT Long Term Goals - 04/15/16 1701      PT LONG TERM GOAL #1   Title The patient will be independent in safe self progression of HEP needed for further improvements in ROM and strength   06/05/16   Status On-going     PT LONG TERM GOAL #2   Title The patient will report a 60% improvement in sleep and pain with usual home /work ADLs    Status On-going     PT LONG TERM GOAL #3   Title Right shoulder flexion/elevation to 120 degrees, external rotation to 50 degrees, internal rotation to L4 region behind back needed for grooming/dressing using  right UE   Status On-going     PT LONG TERM GOAL #4   Title Right shoulder strength grossly 3+/5 to 4-/5  needed for light lifting and return to playing bass guitar   Status On-going     PT LONG TERM GOAL #5   Title FOTO functional outcome score improved from 56% limitation to 35% indicating improved function with less pain   Status On-going               Plan - 06/03/16 1614    Clinical Impression Statement Pt. reporting decreased pain initially  today and increased recent use of R shoulder.  [name] doing well with gentle AROM flexion, scaption today able to perform without scap. elevation however requiring some cueing.  Continued advancement of AAROM without much pain increase today.  IR, ER with yellow TB progressed today with pt. able to demo good technique without pain.  IR, ER with yellow TB issued to pt. via handout.  Pt. ending treatment with 2/10 R shoulder pain and declining ice.  Pt. will continue to benefit from further skilled therapy to improve UE ROM, strength, and function.     PT Treatment/Interventions ADLs/Self Care Home Management;Cryotherapy;Electrical Stimulation;Moist Heat;Therapeutic exercise;Patient/family education;Passive range of motion;Manual techniques;Taping;Vasopneumatic Device   PT Next Visit Plan Address goals for MD f/u on 3.5.18; Continue gentle AROM per pt. tolerance; monitor response to updated HEP (IR, ER with yellow TB); continued manual to improve ROM       Patient will benefit from skilled therapeutic intervention in order to improve the following deficits and impairments:  Decreased range of motion, Decreased strength, Impaired perceived functional ability, Impaired UE functional use, Pain, Increased edema  Visit Diagnosis: Acute pain of right shoulder  Stiffness of right shoulder, not elsewhere classified  Muscle weakness (generalized)     Problem List Patient Active Problem List   Diagnosis Date Noted  . Obesity 07/18/2015  .  OSA (obstructive sleep apnea) 06/07/2013    Kermit BaloMicah Gloriann Riede, PTA 06/03/16 6:39 PM   [facility] [address]  [address] [place], [place], [address] Phone: [phone]   Fax:  [phone]  Name: Kerry Reynolds MRN: [id number] Date of Birth: 02-Jun-1955

## 2016-06-05 ENCOUNTER — Ambulatory Visit: Payer: Worker's Compensation | Attending: Orthopaedic Surgery | Admitting: Physical Therapy

## 2016-06-05 DIAGNOSIS — M6281 Muscle weakness (generalized): Secondary | ICD-10-CM | POA: Diagnosis present

## 2016-06-05 DIAGNOSIS — M25511 Pain in right shoulder: Secondary | ICD-10-CM | POA: Insufficient documentation

## 2016-06-05 DIAGNOSIS — M25611 Stiffness of right shoulder, not elsewhere classified: Secondary | ICD-10-CM | POA: Insufficient documentation

## 2016-06-05 NOTE — Therapy (Signed)
[facility] [address]  [address] [place], [place], [address] Phone: [phone]   Fax:  [phone]  Physical Therapy Treatment  Patient Details  Name: Kerry Reynolds MRN: [id number] Date of Birth: [birth year] Referring Provider: Norlene CampbellPeter Whitfield, MD  Encounter Date: 06/05/2016      PT End of Session - 06/05/16 1620    Visit Number 14   Number of Visits 23   Date for PT Re-Evaluation 07/04/16   Authorization Type U[employer] 23 visit limit   PT Start Time 1620   PT Stop Time 1703   PT Time Calculation (min) 43 min   Activity Tolerance Patient tolerated treatment well   Behavior During Therapy [facility] for tasks assessed/performed      Past Medical History:  Diagnosis Date  . Anxiety   . Depression   . Diabetes (HCC)    type II  . Heart murmur   . HTN (hypertension)   . Hyperlipidemia   . PONV (postoperative nausea and vomiting)   . Sleep apnea   . Tinnitus     Past Surgical History:  Procedure Laterality Date  . BACK SURGERY     lower back  . COLONOSCOPY    . HERNIA REPAIR     [employer]  . LACERATION REPAIR Right    arm  . SHOULDER ARTHROSCOPY WITH OPEN ROTATOR CUFF REPAIR AND DISTAL CLAVICLE ACROMINECTOMY Right 03/06/2016   Procedure: SHOULDER ARTHROSCOPY WITH SUBACROMIAL DECOMPRESSION, DISTAL CLAVICLE RESECTION, MINI-OPEN ROTATOR CUFF REPAIR, [facility]IBLE [facility];  Surgeon: Valeria BatmanPeter W Whitfield, MD;  Location: [facility] OR;  Service: Orthopedics;  Laterality: Right;  Needs RNFA  . WISDOM TOOTH EXTRACTION      There were no vitals filed for this visit.      Subjective Assessment - 06/05/16 1624    Subjective Pt reporting more functional use of R UE with less pain, but still can't reach the back of his head in the shower.   Patient Stated Goals I need to be more active (get back on treadmill);  sleep all night long   Currently in Pain? Yes   Pain Score 1    Pain Location Shoulder   Pain Orientation Right   Pain  Descriptors / Indicators Aching   Pain Type Surgical pain   Pain Onset More than a month ago            [facility] - 06/05/16 1620      Assessment   Medical Diagnosis Right rotator cuff repair   Referring Provider Norlene CampbellPeter Whitfield, MD   Onset Date/Surgical Date 03/06/16   Hand Dominance Right   Next MD Visit 05/05/16     ROM / Strength   AROM / PROM / Strength AROM;PROM     AROM   Right/Left Shoulder Right   Right Shoulder Flexion 90 Degrees  sitting, supine 140 dg   Right Shoulder ABduction 82 Degrees  sitting, supine 137 dg   Right Shoulder Internal Rotation 81 Degrees  supine at 80 dg ABD   Right Shoulder External Rotation 74 Degrees  supine at 80 dg ABD     PROM   Right/Left Shoulder Right   Right Shoulder Flexion 152 Degrees   Right Shoulder ABduction 150 Degrees   Right Shoulder Internal Rotation 88 Degrees  supine at 80 dg ABD   Right Shoulder External Rotation 76 Degrees  supine at 80 dg ABD  [facility] Adult PT Treatment/Exercise - 06/05/16 1620      Shoulder Exercises: Sidelying   External Rotation AROM;Right;10 reps   External Rotation Limitations neutral shoulder   ABduction AROM;Right;10 reps   ABduction Limitations 20-70 dg (pain free range)     Shoulder Exercises: Standing   Flexion AROM;Both;10 reps   Flexion Limitations back against 6" FR on wall   ABduction AROM;Both;10 reps   ABduction Limitations scaption back against 6" FR on wall     Shoulder Exercises: Pulleys   Flexion 3 minutes   ABduction 3 minutes   ABduction Limitations scaption     Shoulder Exercises: Therapy Ball   Flexion 10 reps   Flexion Limitations orange Pball on wall   ABduction 10 reps   ABduction Limitations scaption with orange Pball on wall     Shoulder Exercises: ROM/Strengthening   UBE (Upper Arm Bike) lvl 2.5 fwb/back x3' each   Wall Pushups 10 reps                  PT Short Term Goals - 05/22/16 1636       PT SHORT TERM GOAL #1   Title The patient will initiate knowledge of self care strategies to promote surgical healing (precautionary motion, positioning)   05/08/16   Status Achieved     PT SHORT TERM GOAL #2   Title The patient will report a 30% improvement in sleep   Status Achieved     PT SHORT TERM GOAL #3   Title Passive and active assisted  flexion/elevation right shoulder  to 140 degrees as needed to progress to active ROM   Status Achieved           PT Long Term Goals - 06/05/16 1703      PT LONG TERM GOAL #1   Title The patient will be independent in safe self progression of HEP needed for further improvements in ROM and strength by 07/04/16   Status Revised     PT LONG TERM GOAL #2   Title The patient will report a 60% improvement in sleep and pain with usual home /work ADLs by 06/05/16   Status Achieved     PT LONG TERM GOAL #3   Title Right shoulder flexion/elevation to 120 degrees, external rotation to 50 degrees, internal rotation to L4 region behind back needed for grooming/dressing using right UE by 07/04/16   Status Revised     PT LONG TERM GOAL #4   Title Right shoulder strength grossly 3+/5 to 4-/5  needed for light lifting and return to playing bass guitar by 07/04/16   Status Revised     PT LONG TERM GOAL #5   Title FOTO functional outcome score improved from 56% limitation to 35% indicating improved function with less pain by 07/04/16   Status Revised               Plan - 06/05/16 1626    Clinical Impression Statement Pt has demonstrated good progress with PT thus far, with minimal pain recently. R shoulder PROM nearing full ROM, but AROM remains limited by continued weakness although decreasing substitution noted. Pt reporting improved functional use R UE but continues to be limited with some ADLs such as washing the back of his head in the shower. Recommend recert for additional 2x/wk x 4 wks for continued focus on AROM and strengthening to improve  functional use of R UE.   Rehab Potential Good   Clinical Impairments Affecting Rehab Potential rotator  cuff repair for complete tear 03/06/16   PT Frequency 2x / week   PT Duration 4 weeks   PT Treatment/Interventions ADLs/Self Care Home Management;Cryotherapy;Electrical Stimulation;Moist Heat;Therapeutic exercise;Patient/family education;Passive range of motion;Manual techniques;Taping;Vasopneumatic Device   PT Next Visit Plan Continue AROM & gentle strengthening per pt. tolerance; continued manual therapy to improve ROM as indicated; modalities PRN for pain   Consulted and Agree with Plan of Care Patient      Patient will benefit from skilled therapeutic intervention in order to improve the following deficits and impairments:  Decreased range of motion, Decreased strength, Impaired perceived functional ability, Impaired UE functional use, Pain, Increased edema  Visit Diagnosis: Acute pain of right shoulder  Stiffness of right shoulder, not elsewhere classified  Muscle weakness (generalized)     Problem List Patient Active Problem List   Diagnosis Date Noted  . Obesity 07/18/2015  . OSA (obstructive sleep apnea) 06/07/2013    Marry Guan, PT, MPT 06/05/2016, 5:20 PM  [facility] [address]  [address] [place], [place], [address] Phone: [phone]   Fax:  [phone]  Name: CAILAN GENERAL MRN: [id number] Date of Birth: 01-May-1955

## 2016-06-09 ENCOUNTER — Ambulatory Visit (INDEPENDENT_AMBULATORY_CARE_PROVIDER_SITE_OTHER): Payer: 59 | Admitting: Orthopaedic Surgery

## 2016-06-09 ENCOUNTER — Encounter (INDEPENDENT_AMBULATORY_CARE_PROVIDER_SITE_OTHER): Payer: Self-pay | Admitting: Orthopaedic Surgery

## 2016-06-09 VITALS — BP 135/88 | HR 108 | Resp 12 | Ht 73.0 in | Wt 235.0 lb

## 2016-06-09 DIAGNOSIS — M25511 Pain in right shoulder: Secondary | ICD-10-CM

## 2016-06-09 DIAGNOSIS — G8929 Other chronic pain: Secondary | ICD-10-CM

## 2016-06-09 NOTE — Progress Notes (Signed)
   Office Visit Note   Patient: Kerry Reynolds           Date of Birth: 1955-11-17           MRN: [id number] Visit Date: 06/09/2016              Requested by: Elias Elseobert Reade, MD [phone] [address] [place], [place] [address] PCP: Lolita PatellaEADE,ROBERT ALEXANDER, MD   Assessment & Plan: Visit Diagnoses: 3 months status post rotator cuff tear repair right shoulder and doing well. Minimal pain. Still weak with mild adhesive capsulitis. Working without much problem. Purposefully going slowly with therapy and motion based on the size of his tear Continue physical therapy and follow-up in one month  Follow-Up Instructions: No Follow-up on file.   Orders:  No orders of the defined types were placed in this encounter.  No orders of the defined types were placed in this encounter.     Procedures: No procedures performed   Clinical Data: No additional findings.   Subjective: No chief complaint on file.   Kerry Reynolds is 3 months status post Right shoulder arthroscopy with subacromial decompression, distal, clavicle resection, mini open RTC repair Pt still doing PT, gaining more ROM.    Review of Systems   Objective: Vital Signs: There were no vitals taken for this visit.  Physical Exam  Ortho Examno pain with passive range of motion right shoulder weak with overhead movement. No impingement and negative empty can testing. Neurovascular exam intact distally.  No specialty comments available.  Imaging: No results found.   PMFS History: Patient Active Problem List   Diagnosis Date Noted  . Obesity 07/18/2015  . OSA (obstructive sleep apnea) 06/07/2013   Past Medical History:  Diagnosis Date  . Anxiety   . Depression   . Diabetes (HCC)    type II  . Heart murmur   . HTN (hypertension)   . Hyperlipidemia   . PONV (postoperative nausea and vomiting)   . Sleep apnea   . Tinnitus     Family History  Problem Relation Age of Onset  . Breast cancer Mother   . Diabetes  Maternal Grandmother     Past Surgical History:  Procedure Laterality Date  . BACK SURGERY     lower back  . COLONOSCOPY    . HERNIA REPAIR     [employer]  . LACERATION REPAIR Right    arm  . SHOULDER ARTHROSCOPY WITH OPEN ROTATOR CUFF REPAIR AND DISTAL CLAVICLE ACROMINECTOMY Right 03/06/2016   Procedure: SHOULDER ARTHROSCOPY WITH SUBACROMIAL DECOMPRESSION, DISTAL CLAVICLE RESECTION, MINI-OPEN ROTATOR CUFF REPAIR, POSSIBLE [facility];  Surgeon: Valeria BatmanPeter W Sary Bogie, MD;  Location: [facility] OR;  Service: Orthopedics;  Laterality: Right;  Needs RNFA  . WISDOM TOOTH EXTRACTION     Social History   Occupational History  . teacher    Social History Main Topics  . Smoking status: Never Smoker  . Smokeless tobacco: Never Used  . Alcohol use No  . Drug use: No  . Sexual activity: No

## 2016-06-10 ENCOUNTER — Ambulatory Visit: Payer: Worker's Compensation

## 2016-06-10 DIAGNOSIS — M25511 Pain in right shoulder: Secondary | ICD-10-CM | POA: Diagnosis not present

## 2016-06-10 DIAGNOSIS — M6281 Muscle weakness (generalized): Secondary | ICD-10-CM

## 2016-06-10 DIAGNOSIS — M25611 Stiffness of right shoulder, not elsewhere classified: Secondary | ICD-10-CM

## 2016-06-10 NOTE — Therapy (Signed)
[facility] [address]  [address] [place], [place], [address] Phone: [phone]   Fax:  [phone]  Physical Therapy Treatment  Patient Details  Name: Kerry Reynolds MRN: [id number] Date of Birth: 1956-01-27 Referring Provider: [name], MD  Encounter Date: 06/10/2016      PT End of Session - 06/10/16 1633    Visit Number 15   Number of Visits 23   Date for PT Re-Evaluation 07/04/16   Authorization Type U[employer] 23 visit limit   PT Start Time 1618   PT Stop Time 1715   PT Time Calculation (min) 57 min   Activity Tolerance Patient tolerated treatment well   Behavior During Therapy [facility] for tasks assessed/performed      Past Medical History:  Diagnosis Date  . Anxiety   . Depression   . Diabetes (HCC)    type II  . Heart murmur   . HTN (hypertension)   . Hyperlipidemia   . PONV (postoperative nausea and vomiting)   . Sleep apnea   . Tinnitus     Past Surgical History:  Procedure Laterality Date  . BACK SURGERY     lower back  . COLONOSCOPY    . [employer]ERNIA REPAIR     [employer]  . LACERATION REPAIR Right    arm  . SHOULDER ARTHROSCOPY WITH OPEN ROTATOR CUFF REPAIR AND DISTAL CLAVICLE ACROMINECTOMY Right 03/06/2016   Procedure: SHOULDER ARTHROSCOPY WITH SUBACROMIAL DECOMPRESSION, DISTAL CLAVICLE RESECTION, MINI-OPEN ROTATOR CUFF REPAIR, POSSIBLE [facility] PATCH;  Surgeon: Valeria Batman, MD;  Location: [facility] OR;  Service: Orthopedics;  Laterality: Right;  Needs RNFA  . WISDOM TOOTH EXTRACTION      There were no vitals filed for this visit.      Subjective Assessment - 06/10/16 1622    Subjective Pt. reporting he, "felt a pop yesterday" while performing ER with yellow TB at home.  Pt. has had increased pain since this point.  Pt. saw MD yesterday before this incident.  Pt. reporting MD pleased with progress.     Patient Stated Goals I need to be more active (get back on treadmill);  sleep all night long    Currently in Pain? Yes   Pain Score 2    Pain Location Shoulder   Pain Orientation Right   Pain Descriptors / Indicators Aching   Pain Type Surgical pain   Pain Onset More than a month ago   Pain Frequency Intermittent   Pain Relieving Factors rest   Effect of Pain on Daily Activities limits use of R UE    Multiple Pain Sites No                         [facility] Adult PT Treatment/Exercise - 06/10/16 1637      Shoulder Exercises: Supine   Protraction Right;Weights;20 reps   Protraction Weight (lbs) 4#   Other Supine Exercises Supine R shoulder ABC's with red med ball (1000Gr) in 90dg flexion x 2 reps      Shoulder Exercises: Seated   Flexion AROM;Strengthening;10 reps;Right;Left   Flexion Limitations some increased pain however able to perform without scap. elevation      Shoulder Exercises: Sidelying   External Rotation AROM;Right;10 reps   External Rotation Limitations neutral shoulder  pain remaining low with this     Shoulder Exercises: Standing   Flexion 10 reps;AAROM;Right   Flexion Limitations wall ladder   ABduction 10 reps;AAROM;Right  ABduction Limitations wall ladder    Other Standing Exercises Standing R shoulder pendulums, forward/backward, side<>side x 20 reps      Shoulder Exercises: Pulleys   Flexion 3 minutes   ABduction 3 minutes   ABduction Limitations scaption     Modalities   Modalities Electrical Stimulation     Electrical Stimulation   Electrical Stimulation Location R UT, R anterior shoulder    Electrical Stimulation Action IFC    Electrical Stimulation Parameters 80-150 Hz, intensity to pt. tolerance, 15 min    Electrical Stimulation Goals Pain     Vasopneumatic   Number Minutes Vasopneumatic  15 minutes   Vasopnuematic Location  Shoulder   Vasopneumatic Pressure Medium   Vasopneumatic Temperature  Coldest Temperature                  PT Short Term Goals - 05/22/16 1636      PT SHORT TERM GOAL #1   Title The  patient will initiate knowledge of self care strategies to promote surgical healing (precautionary motion, positioning)   05/08/16   Status Achieved     PT SHORT TERM GOAL #2   Title The patient will report a 30% improvement in sleep   Status Achieved     PT SHORT TERM GOAL #3   Title Passive and active assisted  flexion/elevation right shoulder  to 140 degrees as needed to progress to active ROM   Status Achieved           PT Long Term Goals - 06/05/16 1703      PT LONG TERM GOAL #1   Title The patient will be independent in safe self progression of HEP needed for further improvements in ROM and strength by 07/04/16   Status Revised     PT LONG TERM GOAL #2   Title The patient will report a 60% improvement in sleep and pain with usual home /work ADLs by 06/05/16   Status Achieved     PT LONG TERM GOAL #3   Title Right shoulder flexion/elevation to 120 degrees, external rotation to 50 degrees, internal rotation to L4 region behind back needed for grooming/dressing using right UE by 07/04/16   Status Revised     PT LONG TERM GOAL #4   Title Right shoulder strength grossly 3+/5 to 4-/5  needed for light lifting and return to playing bass guitar by 07/04/16   Status Revised     PT LONG TERM GOAL #5   Title FOTO functional outcome score improved from 56% limitation to 35% indicating improved function with less pain by 07/04/16   Status Revised               Plan - 06/10/16 1636    Clinical Impression Statement Pt. reporting MD pleased with progress with f/u yesterday.  Pt. reports, "feeling pop" while performing ER with yellow TB at home in evening yesterday following visit to MD.  Pt. noting increased pain since this time, which disturbed his sleep last night.  Pain remaining at high level this morning.  PT assessed pt. to begin treatment today.  Pt. able to perform sidelying ER without significant pain increase or weakness.  Pt. able to perform seated AROM flexion without  unusual pain increase.  PT and therapist noting increased muscular tension in R UT area.  STM to R UT initially in treatment thus STM to this area to promote muscular relaxation today.  Therex with conservative approach today.  Pt. reporting normal pain rises  with therex today.  Ice/E-stim combo to R UT/anterior shoulder to promote muscular relaxation and reduce post-exercise swelling and pain.  Pt. leaving treatment today with pain well controlled.  Pt. able to leave treatment today pain free.  Pt. will continue to benefit from further skilled therapy to improve UE ROM, strength, and maximize function.      PT Treatment/Interventions ADLs/Self Care Home Management;Cryotherapy;Electrical Stimulation;Moist Heat;Therapeutic exercise;Patient/family education;Passive range of motion;Manual techniques;Taping;Vasopneumatic Device   PT Next Visit Plan Continue AROM & gentle strengthening per pt. tolerance; continued manual therapy to improve ROM as indicated; modalities PRN for pain      Patient will benefit from skilled therapeutic intervention in order to improve the following deficits and impairments:  Decreased range of motion, Decreased strength, Impaired perceived functional ability, Impaired UE functional use, Pain, Increased edema  Visit Diagnosis: Acute pain of right shoulder  Stiffness of right shoulder, not elsewhere classified  Muscle weakness (generalized)     Problem List Patient Active Problem List   Diagnosis Date Noted  . Obesity 07/18/2015  . OSA (obstructive sleep apnea) 06/07/2013    Kermit BaloMicah Finbar Nippert, PTA 06/10/16 5:34 PM   [facility] [place] [address]  [address] [place], [place], [address] Phone: [phone]   Fax:  [phone]  Name: Kerry Reynolds MRN: [id number] Date of Birth: 1955-11-25

## 2016-06-12 ENCOUNTER — Ambulatory Visit: Payer: Worker's Compensation | Admitting: Physical Therapy

## 2016-06-12 DIAGNOSIS — M25611 Stiffness of right shoulder, not elsewhere classified: Secondary | ICD-10-CM

## 2016-06-12 DIAGNOSIS — M25511 Pain in right shoulder: Secondary | ICD-10-CM | POA: Diagnosis not present

## 2016-06-12 DIAGNOSIS — M6281 Muscle weakness (generalized): Secondary | ICD-10-CM

## 2016-06-12 NOTE — Therapy (Signed)
[facility] [place] [address]  [address] [place], [place], [address] Phone: [phone]   Fax:  [phone]  Physical Therapy Treatment  Patient Details  Name: Kerry Reynolds MRN: [id number] Date of Birth: [birth year] Referring Provider: Norlene CampbellPeter Whitfield, MD   Encounter Date: 06/12/2016      PT End of Session - 06/12/16 1615    Visit Number 16   Number of Visits 23   Date for PT Re-Evaluation 07/04/16   Authorization Type UHC 23 visit limit   PT Start Time 1615   PT Stop Time 1657   PT Time Calculation (min) 42 min   Activity Tolerance Patient tolerated treatment well   Behavior During Therapy [facility] for tasks assessed/performed      Past Medical History:  Diagnosis Date  . Anxiety   . Depression   . Diabetes (HCC)    type II  . Heart murmur   . HTN (hypertension)   . Hyperlipidemia   . PONV (postoperative nausea and vomiting)   . Sleep apnea   . Tinnitus     Past Surgical History:  Procedure Laterality Date  . BACK SURGERY     lower back  . COLONOSCOPY    . HERNIA REPAIR     [employer]  . LACERATION REPAIR Right    arm  . SHOULDER ARTHROSCOPY WITH OPEN ROTATOR CUFF REPAIR AND DISTAL CLAVICLE ACROMINECTOMY Right 03/06/2016   Procedure: SHOULDER ARTHROSCOPY WITH SUBACROMIAL DECOMPRESSION, DISTAL CLAVICLE RESECTION, MINI-OPEN ROTATOR CUFF REPAIR, [facility]IBLE [facility];  Surgeon: Valeria BatmanPeter W Whitfield, MD;  Location: [facility] OR;  Service: Orthopedics;  Laterality: Right;  Needs RNFA  . WISDOM TOOTH EXTRACTION      There were no vitals filed for this visit.      Subjective Assessment - 06/12/16 1621    Subjective Pt reporting pt noted at last visit has reslved but now noting some intermittent pain in the R forearm.   Patient Stated Goals I need to be more active (get back on treadmill);  sleep all night long   Currently in Pain? Yes   Pain Score 1    Pain Location Shoulder   Pain Orientation Right   Pain Descriptors /  Indicators Discomfort   Pain Onset More than a month ago            [facility] PT Assessment - 06/12/16 1615      Assessment   Medical Diagnosis Right rotator cuff repair   Referring Provider Norlene CampbellPeter Whitfield, MD    Onset Date/Surgical Date 03/06/16   Next MD Visit 07/21/16                     [facility] Adult PT Treatment/Exercise - 06/12/16 1615      Shoulder Exercises: Supine   Other Supine Exercises R shoudler circles at 90 dg flexion CW/CCW 4# x10     Shoulder Exercises: Prone   Extension Both;20 reps   Extension Limitations "I" with green Pball   Horizontal ABduction 1 Both;10 reps   Horizontal ABduction 1 Limitations "T" with green Pball   Horizontal ABduction 2 Both;10 reps   Horizontal ABduction 2 Limitations "Y" with green Pball     Shoulder Exercises: Sidelying   External Rotation Right;10 reps   External Rotation Weight (lbs) 1   External Rotation Limitations neutral shoulder   ABduction Right;10 reps;Weights   ABduction Weight (lbs) 1   ABduction Limitations 20-90 dg (pain free range)   Other Sidelying Exercises  R shoudler circles at 90 dg ABD CW/CCW 1# x10     Shoulder Exercises: Standing   Flexion AROM;Both;12 reps   Flexion Limitations back against 6" FR on wall   ABduction AROM;Both;12 reps   ABduction Limitations scaption, back against 6" FR on wall     Shoulder Exercises: Therapy Ball   Flexion 10 reps   Flexion Limitations orange Pball on wall   ABduction 10 reps   ABduction Limitations scaption with orange Pball on wall     Shoulder Exercises: ROM/Strengthening   UBE (Upper Arm Bike) lvl 3.0 fwb/back x3' each   Wall Pushups 10 reps                  PT Short Term Goals - 05/22/16 1636      PT SHORT TERM GOAL #1   Title The patient will initiate knowledge of self care strategies to promote surgical healing (precautionary motion, positioning)   05/08/16   Status Achieved     PT SHORT TERM GOAL #2   Title The patient will  report a 30% improvement in sleep   Status Achieved     PT SHORT TERM GOAL #3   Title Passive and active assisted  flexion/elevation right shoulder  to 140 degrees as needed to progress to active ROM   Status Achieved           PT Long Term Goals - 06/05/16 1703      PT LONG TERM GOAL #1   Title The patient will be independent in safe self progression of HEP needed for further improvements in ROM and strength by 07/04/16   Status Revised     PT LONG TERM GOAL #2   Title The patient will report a 60% improvement in sleep and pain with usual home /work ADLs by 06/05/16   Status Achieved     PT LONG TERM GOAL #3   Title Right shoulder flexion/elevation to 120 degrees, external rotation to 50 degrees, internal rotation to L4 region behind back needed for grooming/dressing using right UE by 07/04/16   Status Revised     PT LONG TERM GOAL #4   Title Right shoulder strength grossly 3+/5 to 4-/5  needed for light lifting and return to playing bass guitar by 07/04/16   Status Revised     PT LONG TERM GOAL #5   Title FOTO functional outcome score improved from 56% limitation to 35% indicating improved function with less pain by 07/04/16   Status Revised               Plan - 06/12/16 1657    Clinical Impression Statement Pain reported at previous visit resolved with pt only noting very mild discomfort today. Reported new pain in R forearm, but not present at time of PT session. Able to resume progression of RTC strengthening and scapular stabilization today w/o increased pain, with pt demonstrating gradually improving AROM against gravity. Pt declined vaso at end of viist.   Rehab Potential Good   Clinical Impairments Affecting Rehab Potential rotator cuff repair for complete tear 03/06/16   PT Treatment/Interventions ADLs/Self Care Home Management;Cryotherapy;Electrical Stimulation;Moist Heat;Therapeutic exercise;Patient/family education;Passive range of motion;Manual  techniques;Taping;Vasopneumatic Device   PT Next Visit Plan Continue AROM & gentle strengthening per pt. tolerance; continued manual therapy to improve ROM as indicated; modalities PRN for pain   Consulted and Agree with Plan of Care Patient      Patient will benefit from skilled therapeutic intervention in order to improve  the following deficits and impairments:  Decreased range of motion, Decreased strength, Impaired perceived functional ability, Impaired UE functional use, Pain, Increased edema  Visit Diagnosis: Acute pain of right shoulder  Stiffness of right shoulder, not elsewhere classified  Muscle weakness (generalized)     Problem List Patient Active Problem List   Diagnosis Date Noted  . Obesity 07/18/2015  . OSA (obstructive sleep apnea) 06/07/2013    Marry Guan, PT, MPT 06/12/2016, 6:10 PM  [facility] [address]  [address] [place], [place], [address] Phone: [phone]   Fax:  [phone]  Name: EDY BELT MRN: [id number] Date of Birth: Aug 24, 1955

## 2016-06-17 ENCOUNTER — Ambulatory Visit: Payer: Worker's Compensation

## 2016-06-17 DIAGNOSIS — M6281 Muscle weakness (generalized): Secondary | ICD-10-CM

## 2016-06-17 DIAGNOSIS — M25611 Stiffness of right shoulder, not elsewhere classified: Secondary | ICD-10-CM

## 2016-06-17 DIAGNOSIS — M25511 Pain in right shoulder: Secondary | ICD-10-CM | POA: Diagnosis not present

## 2016-06-17 NOTE — Therapy (Signed)
[facility] [address]  [address] [place], [place], [address] Phone: [phone]   Fax:  [phone]  Physical Therapy Treatment  Patient Details  Name: Kerry Reynolds MRN: [id number] Date of Birth: [birth year] Referring Provider: Norlene CampbellPeter Whitfield, MD   Encounter Date: 06/17/2016      PT End of Session - 06/17/16 1621    Visit Number 17   Number of Visits 23   Date for PT Re-Evaluation 07/04/16   Authorization Type U[employer] 23 visit limit   PT Start Time 1616   PT Stop Time 1700   PT Time Calculation (min) 44 min   Activity Tolerance Patient tolerated treatment well   Behavior [facility] for tasks assessed/performed      Past Medical History:  Diagnosis Date  . Anxiety   . Depression   . Diabetes (HCC)    type II  . Heart murmur   . HTN (hypertension)   . Hyperlipidemia   . PONV (postoperative nausea and vomiting)   . Sleep apnea   . Tinnitus     Past Surgical History:  Procedure Laterality Date  . BACK SURGERY     lower back  . COLONOSCOPY    . HERNIA REPAIR     [employer]  . LACERATION REPAIR Right    arm  . SHOULDER ARTHROSCOPY WITH OPEN ROTATOR CUFF REPAIR AND DISTAL CLAVICLE ACROMINECTOMY Right 03/06/2016   Procedure: SHOULDER ARTHROSCOPY WITH SUBACROMIAL DECOMPRESSION, DISTAL CLAVICLE RESECTION, MINI-OPEN ROTATOR CUFF REPAIR, [facility]IBLE [facility];  Surgeon: Valeria BatmanPeter W Whitfield, MD;  Location: [facility] OR;  Service: Orthopedics;  Laterality: Right;  Needs RNFA  . WISDOM TOOTH EXTRACTION      There were no vitals filed for this visit.      Subjective Assessment - 06/17/16 1617    Subjective Pt. reporting increased R shoulder pain while, "swatting a fly" yesterday.  Low level pain over weekend.     Patient Stated Goals I need to be more active (get back on treadmill);  sleep all night long   Currently in Pain? Yes   Pain Score 1    Pain Location Shoulder   Pain Orientation Right   Pain Descriptors /  Indicators Dull   Pain Type Surgical pain   Pain Onset More than a month ago   Pain Frequency Intermittent   Aggravating Factors  PT exercises    Pain Relieving Factors rest   Effect of Pain on Daily Activities Still hesitant to reach for things   Multiple Pain Sites No                         [facility] Adult PT Treatment/Exercise - 06/17/16 1625      Shoulder Exercises: Supine   Protraction Right;Weights;15 reps;AROM   Protraction Weight (lbs) 5#      Shoulder Exercises: Sidelying   ABduction Right;Weights;15 reps   ABduction Weight (lbs) 1   ABduction Limitations 20-90 dg (pain free range)   Other Sidelying Exercises R shoudler circles at 90 dg ABD CW/CCW 1# x 20 each way     Shoulder Exercises: Standing   Flexion AROM;Both;15 reps  still with mild scap. elevation requiring tactile to prevent   Flexion Limitations back against 6" FR on wall   ABduction AROM;Both;15 reps   ABduction Limitations scaption, back against 6" FR on wall  still with mild scap elevation requiring tactile to prevent    Other Standing Exercises wall wash  R shoulder ~ 2 foot arc x 10 reps at shoulder height; 3-4/10 pain with this     Shoulder Exercises: Therapy Ball   Flexion 10 reps  with 1# cuffweight around wrist    Flexion Limitations orange Pball on wall   ABduction 10 reps   ABduction Limitations scaption with orange Pball on wall     Shoulder Exercises: ROM/Strengthening   UBE (Upper Arm Bike) lvl 3.5 fwb/back x 3' each   Wall Pushups 15 reps                  PT Short Term Goals - 05/22/16 1636      PT SHORT TERM GOAL #1   Title The patient will initiate knowledge of self care strategies to promote surgical healing (precautionary motion, positioning)   05/08/16   Status Achieved     PT SHORT TERM GOAL #2   Title The patient will report a 30% improvement in sleep   Status Achieved     PT SHORT TERM GOAL #3   Title Passive and active assisted  flexion/elevation  right shoulder  to 140 degrees as needed to progress to active ROM   Status Achieved           PT Long Term Goals - 06/05/16 1703      PT LONG TERM GOAL #1   Title The patient will be independent in safe self progression of HEP needed for further improvements in ROM and strength by 07/04/16   Status Revised     PT LONG TERM GOAL #2   Title The patient will report a 60% improvement in sleep and pain with usual home /work ADLs by 06/05/16   Status Achieved     PT LONG TERM GOAL #3   Title Right shoulder flexion/elevation to 120 degrees, external rotation to 50 degrees, internal rotation to L4 region behind back needed for grooming/dressing using right UE by 07/04/16   Status Revised     PT LONG TERM GOAL #4   Title Right shoulder strength grossly 3+/5 to 4-/5  needed for light lifting and return to playing bass guitar by 07/04/16   Status Revised     PT LONG TERM GOAL #5   Title FOTO functional outcome score improved from 56% limitation to 35% indicating improved function with less pain by 07/04/16   Status Revised               Plan - 06/17/16 1621    Clinical Impression Statement Continued AROM progression against gravity today with addition of small arc wall wash at shoulder height.  Pain rising with wall wash however quickly recovering to baseline.  Pt. still with mild scap. elevation near end of flexion, scaption set today.  Prone on p-ball I's, T's, and Y's continued today at same level with pt. confirming good challenge.  Pt. ending treatment with low-level pain and declining ice.  Will continue to benefit from further skilled therapy to maximize function.     PT Treatment/Interventions ADLs/Self Care Home Management;Cryotherapy;Electrical Stimulation;Moist Heat;Therapeutic exercise;Patient/family education;Passive range of motion;Manual techniques;Taping;Vasopneumatic Device   PT Next Visit Plan Continue AROM & gentle strengthening per pt. tolerance; continued manual therapy  to improve ROM as indicated; modalities PRN for pain      Patient will benefit from skilled therapeutic intervention in order to improve the following deficits and impairments:  Decreased range of motion, Decreased strength, Impaired perceived functional ability, Impaired UE functional use, Pain, Increased edema  Visit Diagnosis: Acute  pain of right shoulder  Stiffness of right shoulder, not elsewhere classified  Muscle weakness (generalized)     Problem List Patient Active Problem List   Diagnosis Date Noted  . Obesity 07/18/2015  . OSA (obstructive sleep apnea) 06/07/2013    Kermit Balo, PTA 06/17/16 5:14 PM  [facility] [address]  [address] [place], [place], [address] Phone: [phone]   Fax:  [phone]  Name: Kerry Reynolds MRN: [id number] Date of Birth: 07/12/1955

## 2016-06-19 ENCOUNTER — Ambulatory Visit: Payer: Worker's Compensation | Admitting: Physical Therapy

## 2016-06-19 DIAGNOSIS — M25511 Pain in right shoulder: Secondary | ICD-10-CM | POA: Diagnosis not present

## 2016-06-19 DIAGNOSIS — M25611 Stiffness of right shoulder, not elsewhere classified: Secondary | ICD-10-CM

## 2016-06-19 DIAGNOSIS — M6281 Muscle weakness (generalized): Secondary | ICD-10-CM

## 2016-06-19 NOTE — Therapy (Signed)
[facility] [address]  [address] [place], [place], [address] Phone: [phone]   Fax:  [phone]  Physical Therapy Treatment  Patient Details  Name: Kerry Reynolds MRN: [id number] Date of Birth: Feb 27, 1956 Referring Provider: [name], MD   Encounter Date: 06/19/2016      PT End of Session - 06/19/16 1617    Visit Number 18   Number of Visits 23   Date for PT Re-Evaluation 07/04/16   Authorization Type UHC 23 visit limit   PT Start Time 1617   PT Stop Time 1700   PT Time Calculation (min) 43 min   Activity Tolerance Patient tolerated treatment well   Behavior During Therapy [facility] for tasks assessed/performed      Past Medical History:  Diagnosis Date  . Anxiety   . Depression   . Diabetes (HCC)    type II  . Heart murmur   . HTN (hypertension)   . Hyperlipidemia   . PONV (postoperative nausea and vomiting)   . Sleep apnea   . Tinnitus     Past Surgical History:  Procedure Laterality Date  . BACK SURGERY     lower back  . COLONOSCOPY    . HERNIA REPAIR     [employer]  . LACERATION REPAIR Right    arm  . SHOULDER ARTHROSCOPY WITH OPEN ROTATOR CUFF REPAIR AND DISTAL CLAVICLE ACROMINECTOMY Right 03/06/2016   Procedure: SHOULDER ARTHROSCOPY WITH SUBACROMIAL DECOMPRESSION, DISTAL CLAVICLE RESECTION, MINI-OPEN ROTATOR CUFF REPAIR, POSSIBLE [facility] PATCH;  Surgeon: Valeria Batman, MD;  Location: [facility] OR;  Service: Orthopedics;  Laterality: Right;  Needs RNFA  . WISDOM TOOTH EXTRACTION      There were no vitals filed for this visit.      Subjective Assessment - 06/19/16 1617    Subjective Pain flare-up from last week seems to have resolved with minimal to no pain overall this week.   Patient Stated Goals I need to be more active (get back on treadmill);  sleep all night long   Currently in Pain? No/denies   Pain Onset More than a month ago                         [facility] Adult PT  Treatment/Exercise - 06/19/16 1617      Shoulder Exercises: Prone   Extension Both;15 reps;Weights   Extension Weight (lbs) 1   Extension Limitations "I" with green Pball   External Rotation Both;10 reps   External Rotation Limitations "W" with green Pball   Horizontal ABduction 1 Both;15 reps;Weights   Horizontal ABduction 1 Weight (lbs) 1   Horizontal ABduction 1 Limitations "T" with green Pball   Horizontal ABduction 2 Both;15 reps   Horizontal ABduction 2 Limitations "Y" with green Pball     Shoulder Exercises: Standing   External Rotation Right;15 reps;Theraband   Theraband Level (Shoulder External Rotation) Level 2 (Red)   External Rotation Limitations neutral shoulder   Internal Rotation Right;15 reps;Theraband   Theraband Level (Shoulder Internal Rotation) Level 2 (Red)   Internal Rotation Limitations neutral shoudler   Flexion Right;15 reps;Theraband   Theraband Level (Shoulder Flexion) Level 1 (Yellow)   Flexion Limitations to 90 dg   Other Standing Exercises Placing 1# db to lower & middle shelves in overhead cabinet with flexion & scaption pattern x 10 each     Shoulder Exercises: Therapy Ball   Flexion 15 reps  2# cuff wt on wrist  Flexion Limitations orange Pball on wall   ABduction 15 reps  1# cuff wt on wrist    ABduction Limitations scaption with orange Pball on wall     Shoulder Exercises: ROM/Strengthening   UBE (Upper Arm Bike) lvl 3.5 fwb/back x 3' each   Wall Wash limited range overhead arc 1# x10                  PT Short Term Goals - 05/22/16 1636      PT SHORT TERM GOAL #1   Title The patient will initiate knowledge of self care strategies to promote surgical healing (precautionary motion, positioning)   05/08/16   Status Achieved     PT SHORT TERM GOAL #2   Title The patient will report a 30% improvement in sleep   Status Achieved     PT SHORT TERM GOAL #3   Title Passive and active assisted  flexion/elevation right shoulder  to  140 degrees as needed to progress to active ROM   Status Achieved           PT Long Term Goals - 06/19/16 1621      PT LONG TERM GOAL #1   Title The patient will be independent in safe self progression of HEP needed for further improvements in ROM and strength by 07/04/16   Status Revised     PT LONG TERM GOAL #2   Title The patient will report a 60% improvement in sleep and pain with usual home /work ADLs by 06/05/16   Status Achieved     PT LONG TERM GOAL #3   Title Right shoulder flexion/elevation to 120 degrees, external rotation to 50 degrees, internal rotation to L4 region behind back needed for grooming/dressing using right UE by 07/04/16   Status On-going     PT LONG TERM GOAL #4   Title Right shoulder strength grossly 3+/5 to 4-/5  needed for light lifting and return to playing bass guitar by 07/04/16   Status On-going     PT LONG TERM GOAL #5   Title FOTO functional outcome score improved from 56% limitation to 35% indicating improved function with less pain by 07/04/16   Status On-going               Plan - 06/19/16 1622    Clinical Impression Statement Continued improvement noted with R shoulder AROM and strengthening, but pt remains limited with higher overhead ROM. Pt tolerating continued progression of RTC strengthening but given insurance limitation of 23 visits/year, will consider reducing frequency to 1x/wk with increased emphasis on HEP updates to conserve remaining visits to extend overall duration of therapy.   Rehab Potential Good   Clinical Impairments Affecting Rehab Potential rotator cuff repair for complete tear 03/06/16   PT Treatment/Interventions ADLs/Self Care Home Management;Cryotherapy;Electrical Stimulation;Moist Heat;Therapeutic exercise;Patient/family education;Passive range of motion;Manual techniques;Taping;Vasopneumatic Device   PT Next Visit Plan Continue R shoulder AROM & RTC strengthening per pt. tolerance; continued manual therapy to  improve ROM as indicated; modalities PRN for pain   Consulted and Agree with Plan of Care Patient      Patient will benefit from skilled therapeutic intervention in order to improve the following deficits and impairments:  Decreased range of motion, Decreased strength, Impaired perceived functional ability, Impaired UE functional use, Pain, Increased edema  Visit Diagnosis: Acute pain of right shoulder  Stiffness of right shoulder, not elsewhere classified  Muscle weakness (generalized)     Problem List Patient Active Problem List  Diagnosis Date Noted  . Obesity 07/18/2015  . OSA (obstructive sleep apnea) 06/07/2013    Marry Guan, PT, MPT 06/19/2016, 5:38 PM  [facility] [address]  [address] [place], [place], [address] Phone: [phone]   Fax:  [phone]  Name: Kerry Reynolds MRN: [id number] Date of Birth: 30-Dec-1955

## 2016-06-24 ENCOUNTER — Ambulatory Visit: Payer: Worker's Compensation

## 2016-06-25 ENCOUNTER — Telehealth (INDEPENDENT_AMBULATORY_CARE_PROVIDER_SITE_OTHER): Payer: Self-pay

## 2016-06-25 NOTE — Telephone Encounter (Signed)
I received a voicemail from work comp requesting any work notes for this patient so they can pay him for any loss time. I did not see any for him. Can you check behind me and advise if he was out of work for any length of time before or after surgery? Note will need to be faxed to her @ [phone]

## 2016-06-25 NOTE — Telephone Encounter (Signed)
I don't see any notes either.

## 2016-06-26 ENCOUNTER — Ambulatory Visit: Payer: Worker's Compensation | Admitting: Physical Therapy

## 2016-06-26 NOTE — Telephone Encounter (Signed)
Called back and left voicemail advising

## 2016-07-01 ENCOUNTER — Ambulatory Visit: Payer: Worker's Compensation

## 2016-07-03 ENCOUNTER — Ambulatory Visit: Payer: Worker's Compensation | Admitting: Physical Therapy

## 2016-07-10 ENCOUNTER — Ambulatory Visit: Payer: Worker's Compensation

## 2016-07-14 ENCOUNTER — Ambulatory Visit: Payer: Worker's Compensation | Attending: Orthopaedic Surgery | Admitting: Physical Therapy

## 2016-07-14 DIAGNOSIS — M6281 Muscle weakness (generalized): Secondary | ICD-10-CM | POA: Insufficient documentation

## 2016-07-14 DIAGNOSIS — M25511 Pain in right shoulder: Secondary | ICD-10-CM | POA: Insufficient documentation

## 2016-07-14 DIAGNOSIS — M25611 Stiffness of right shoulder, not elsewhere classified: Secondary | ICD-10-CM | POA: Insufficient documentation

## 2016-07-17 ENCOUNTER — Ambulatory Visit: Payer: Worker's Compensation | Admitting: Physical Therapy

## 2016-07-17 DIAGNOSIS — M25611 Stiffness of right shoulder, not elsewhere classified: Secondary | ICD-10-CM

## 2016-07-17 DIAGNOSIS — M6281 Muscle weakness (generalized): Secondary | ICD-10-CM | POA: Diagnosis present

## 2016-07-17 DIAGNOSIS — M25511 Pain in right shoulder: Secondary | ICD-10-CM | POA: Diagnosis not present

## 2016-07-17 NOTE — Therapy (Signed)
[facility] [address]  [address] [place], [place], [address] Phone: [phone]   Fax:  [phone]  Physical Therapy Treatment  Patient Details  Name: Kerry Reynolds MRN: [id number] Date of Birth: 29-Sep-1955 Referring Provider: [name], MD   Encounter Date: 07/17/2016      PT End of Session - 07/17/16 1618    Visit Number 19   Number of Visits 23   Date for PT Re-Evaluation 08/15/16   Authorization Type UHC 23 visit limit   PT Start Time 1618   PT Stop Time 1701   PT Time Calculation (min) 43 min   Activity Tolerance Patient tolerated treatment well   Behavior During Therapy [facility] for tasks assessed/performed      Past Medical History:  Diagnosis Date  . Anxiety   . Depression   . Diabetes (HCC)    type II  . Heart murmur   . HTN (hypertension)   . Hyperlipidemia   . PONV (postoperative nausea and vomiting)   . Sleep apnea   . Tinnitus     Past Surgical History:  Procedure Laterality Date  . BACK SURGERY     lower back  . COLONOSCOPY    . HERNIA REPAIR     [employer]  . LACERATION REPAIR Right    arm  . SHOULDER ARTHROSCOPY WITH OPEN ROTATOR CUFF REPAIR AND DISTAL CLAVICLE ACROMINECTOMY Right 03/06/2016   Procedure: SHOULDER ARTHROSCOPY WITH SUBACROMIAL DECOMPRESSION, DISTAL CLAVICLE RESECTION, MINI-OPEN ROTATOR CUFF REPAIR, POSSIBLE [facility] PATCH;  Surgeon: Valeria Batman, MD;  Location: [facility] OR;  Service: Orthopedics;  Laterality: Right;  Needs RNFA  . WISDOM TOOTH EXTRACTION      There were no vitals filed for this visit.      Subjective Assessment - 07/17/16 1621    Subjective Pt reporting dull ache most of the time at 1/10, briefly up to 2/10 at times.   Patient Stated Goals I need to be more active (get back on treadmill);  sleep all night long   Currently in Pain? Yes   Pain Score 1    Pain Location Shoulder   Pain Orientation Right   Pain Descriptors / Indicators Dull;Aching   Pain  Type Surgical pain   Pain Onset More than a month ago   Pain Frequency Intermittent   Aggravating Factors  during exercises   Pain Relieving Factors feels better after exercises, rest   Effect of Pain on Daily Activities difficulty reaching up or behind head            [facility] PT Assessment - 07/17/16 1618      Assessment   Medical Diagnosis Right rotator cuff repair   Referring Provider [name], MD    Onset Date/Surgical Date 03/06/16   Next MD Visit 07/21/16     AROM   Right Shoulder Flexion 106 Degrees  sitting, 140 dg supine   Right Shoulder ABduction 91 Degrees  sitting, 128 dg supine    Right Shoulder Internal Rotation 78 Degrees   Right Shoulder External Rotation 72 Degrees     PROM   Right Shoulder Flexion 155 Degrees   Right Shoulder ABduction 154 Degrees   Right Shoulder Internal Rotation 89 Degrees   Right Shoulder External Rotation 83 Degrees     Strength   Right Shoulder Flexion 3+/5  for available range   Right Shoulder ABduction 3+/5  for available range   Right Shoulder Internal Rotation 4/5   Right Shoulder  External Rotation 4-/5                     [facility] Adult PT Treatment/Exercise - 07/17/16 1618      Shoulder Exercises: Standing   Flexion Strengthening;Both;10 reps;Weights   Shoulder Flexion Weight (lbs) 1   Flexion Limitations back against 6" FR on wall; verbal & tactile cues for scapular depression   ABduction AROM;Both;15 reps   ABduction Limitations scaption, back against 6" FR on wall; verbal & tactile cues for scapular depression   Other Standing Exercises Placing 1# db to lower & middle shelves in overhead cabinet with flexion & scaption pattern x 10 each - verbal & tactile cues to avoid hiking shoulder     Shoulder Exercises: Therapy Ball   Flexion 15 reps  2# cuff wt on wrist    Flexion Limitations orange Pball on wall     Shoulder Exercises: ROM/Strengthening   UBE (Upper Arm Bike) lvl 3.5 fwb/back x 3' each      Manual Therapy   Manual Therapy Joint mobilization;Soft tissue mobilization;Myofascial release;Passive ROM   Joint Mobilization grade 3-4 inf & A/P glide R shoulder   Soft tissue mobilization R pecs, periscapular & RTC muscles   Myofascial Release R subscapularis & teres minor                  PT Short Term Goals - 05/22/16 1636      PT SHORT TERM GOAL #1   Title The patient will initiate knowledge of self care strategies to promote surgical healing (precautionary motion, positioning)   05/08/16   Status Achieved     PT SHORT TERM GOAL #2   Title The patient will report a 30% improvement in sleep   Status Achieved     PT SHORT TERM GOAL #3   Title Passive and active assisted  flexion/elevation right shoulder  to 140 degrees as needed to progress to active ROM   Status Achieved           PT Long Term Goals - 07/17/16 1744      PT LONG TERM GOAL #1   Title The patient will be independent in safe self progression of HEP needed for further improvements in ROM and strength by 08/15/16   Status Revised     PT LONG TERM GOAL #2   Title The patient will report a 60% improvement in sleep and pain with usual home /work ADLs by 06/05/16   Status Achieved     PT LONG TERM GOAL #3   Title Right shoulder flexion/elevation to 120 degrees, external rotation to 50 degrees, internal rotation to L4 region behind back needed for grooming/dressing using right UE by 08/15/16   Status Revised     PT LONG TERM GOAL #4   Title Right shoulder strength grossly 4-/5 or greater needed for light lifting and return to playing bass guitar by 08/15/16   Status Revised     PT LONG TERM GOAL #5   Title FOTO functional outcome score improved from 56% limitation to 35% indicating improved function with less pain by 08/15/16   Status Revised               Plan - 07/17/16 1625    Clinical Impression Statement Pt returning to PT for 1st time in 4 weeks due to being away on vacation and admits  to not completing HEP while on vacation. R shoulder AROM demonstrating slight improvement in flexion & abduction but  continued propensity for scapular elevation & shoulder hiking persists. R shoulder AROM for IR & ER essentially unchanged. Available PROM [facility] for all planes but pt continues to have significant tightness/increased muscle tension in posterior/inferior shoulder girdle. ER/IR strength 4-/5 to 4/5 but flexion and abduction only 3+/5 for available range. Limited progress with PT due to inconsistency with attendance, poor compliance with HEP and early non-adherence to post-op restrictions. Pt only has 4 visits remaining in insurance coverage for current benefit period, therefore plan to continue PT 1x/wk for next 4 weeks with emphasis on HEP progression but will need to have better pt compliance with HEP in order to see functional progress.   Rehab Potential Good   Clinical Impairments Affecting Rehab Potential rotator cuff repair for complete tear 03/06/16   PT Frequency 1x / week   PT Duration 4 weeks   PT Treatment/Interventions ADLs/Self Care Home Management;Cryotherapy;Electrical Stimulation;Moist Heat;Therapeutic exercise;Patient/family education;Passive range of motion;Manual techniques;Taping;Vasopneumatic Device   PT Next Visit Plan Continue R shoulder AROM & RTC strengthening per pt. tolerance; continued manual therapy to improve ROM as indicated; modalities PRN for pain   Consulted and Agree with Plan of Care Patient      Patient will benefit from skilled therapeutic intervention in order to improve the following deficits and impairments:  Decreased range of motion, Decreased strength, Impaired perceived functional ability, Impaired UE functional use, Pain, Increased edema  Visit Diagnosis: Acute pain of right shoulder  Stiffness of right shoulder, not elsewhere classified  Muscle weakness (generalized)     Problem List Patient Active Problem List   Diagnosis Date Noted   . Obesity 07/18/2015  . OSA (obstructive sleep apnea) 06/07/2013    Marry Guan, PT, MPT 07/17/2016, 5:54 PM  [facility] [address]  [address] [place], [place], [address] Phone: [phone]   Fax:  [phone]  Name: STROTHER EVERITT MRN: [id number] Date of Birth: 02-Jun-1955

## 2016-07-21 ENCOUNTER — Ambulatory Visit (INDEPENDENT_AMBULATORY_CARE_PROVIDER_SITE_OTHER): Payer: Worker's Compensation | Admitting: Orthopaedic Surgery

## 2016-07-21 ENCOUNTER — Encounter (INDEPENDENT_AMBULATORY_CARE_PROVIDER_SITE_OTHER): Payer: Self-pay | Admitting: Orthopaedic Surgery

## 2016-07-21 VITALS — BP 128/92 | HR 105 | Ht 73.0 in | Wt 245.0 lb

## 2016-07-21 DIAGNOSIS — M25511 Pain in right shoulder: Secondary | ICD-10-CM

## 2016-07-21 DIAGNOSIS — G8929 Other chronic pain: Secondary | ICD-10-CM

## 2016-07-21 NOTE — Progress Notes (Signed)
Office Visit Note   Patient: Kerry Reynolds           Date of Birth: 03-17-1956           MRN: [id number] Visit Date: 07/21/2016              Requested by: Elias Else, MD [phone] [address] [place], [place] [address] PCP: Lolita Patella, MD   Assessment & Plan: Visit Diagnoses:  1. Chronic right shoulder pain   Over 4 months status post arthroscopic subacromial decompression, distal clavicle resection and mini open rotator cuff tear repair. Limitation of overhead motion. Still with some weakness  Plan: Urge Mr. Faucett to continue with his shoulder strengthening exercises. He certainly is much improved from his preoperative status. Has mild adhesive capsulitis and have counseled him on stretching exercises. We'll see back on a when necessary basis  Follow-Up Instructions: Return if symptoms worsen or fail to improve.   Orders:  No orders of the defined types were placed in this encounter.  No orders of the defined types were placed in this encounter.     Procedures: No procedures performed   Clinical Data: No additional findings.   Subjective: Chief Complaint  Patient presents with  . Right Shoulder - Routine Post Op    [name] is a 61 y o male 4 1/2 months status post right shoulder, rotator cuff repair Pt has 3 visits left for PT  Mr. Germond is doing relatively well after his right shoulder surgery as mentioned above. He certainly is better than he was before surgery but still having a few limitations of overhead motion. He is experiencing some weakness of the shoulder girdle musculature with overhead activity. Denies any numbness or tingling. Functionally doing well  HPI  Review of Systems   Objective: Vital Signs: BP (!) 128/92   Pulse (!) 105   Ht  (1.854 m)   Wt 245 lb (111.1 kg)   BMI 32.32 kg/m   Physical Exam  Ortho Exam lacks just a few degrees to full overhead flexion. Motion is quick. Difficulty placing his hand at the  thoracolumbar junction but can't touch the lumbosacral junction. Extremes of motion  feels a little bit tight. Incisions healed nicely. Neurovascular exam intact. No pain with range of motion of cervical spine. No localized areas of tenderness Specialty Comments:  No specialty comments available.  Imaging: No results found.   PMFS History: Patient Active Problem List   Diagnosis Date Noted  . Obesity 07/18/2015  . OSA (obstructive sleep apnea) 06/07/2013   Past Medical History:  Diagnosis Date  . Anxiety   . Depression   . Diabetes (HCC)    type II  . Heart murmur   . HTN (hypertension)   . Hyperlipidemia   . PONV (postoperative nausea and vomiting)   . Sleep apnea   . Tinnitus     Family History  Problem Relation Age of Onset  . Breast cancer Mother   . Diabetes Maternal Grandmother     Past Surgical History:  Procedure Laterality Date  . BACK SURGERY     lower back  . COLONOSCOPY    . HERNIA REPAIR     [employer]  . LACERATION REPAIR Right    arm  . SHOULDER ARTHROSCOPY WITH OPEN ROTATOR CUFF REPAIR AND DISTAL CLAVICLE ACROMINECTOMY Right 03/06/2016   Procedure: SHOULDER ARTHROSCOPY WITH SUBACROMIAL DECOMPRESSION, DISTAL CLAVICLE RESECTION, MINI-OPEN ROTATOR CUFF REPAIR, POSSIBLE [facility] PATCH;  Surgeon: Valeria Batman,  MD;  Location: [facility];  Service: Orthopedics;  Laterality: Right;  Needs RNFA  . WISDOM TOOTH EXTRACTION     Social History   Occupational History  . teacher    Social History Main Topics  . Smoking status: Never Smoker  . Smokeless tobacco: Never Used  . Alcohol use No  . Drug use: No  . Sexual activity: No

## 2016-07-24 ENCOUNTER — Ambulatory Visit: Payer: Worker's Compensation | Admitting: Physical Therapy

## 2016-07-24 ENCOUNTER — Telehealth (INDEPENDENT_AMBULATORY_CARE_PROVIDER_SITE_OTHER): Payer: Self-pay

## 2016-07-24 DIAGNOSIS — M25611 Stiffness of right shoulder, not elsewhere classified: Secondary | ICD-10-CM

## 2016-07-24 DIAGNOSIS — M25511 Pain in right shoulder: Secondary | ICD-10-CM

## 2016-07-24 DIAGNOSIS — M6281 Muscle weakness (generalized): Secondary | ICD-10-CM

## 2016-07-24 NOTE — Telephone Encounter (Signed)
Faxed April, March, and Jan notes from Dr. Cleophas Dunker per her request

## 2016-07-24 NOTE — Therapy (Signed)
[facility] [address]  [place], [place], [address] Phone: [phone]   Fax:  [phone]  Physical Therapy Treatment  Patient Details  Name: Kerry Reynolds MRN: [id number] Date of Birth: [birth year] Referring Provider: Joni Fears, MD   Encounter Date: 07/24/2016      PT End of Session - 07/24/16 1615    Visit Number 20   Number of Visits 23   Date for PT Re-Evaluation 08/15/16   Authorization Type U[employer] 23 visit limit   PT Start Time 1615   PT Stop Time 1701   PT Time Calculation (min) 46 min   Activity Tolerance Patient tolerated treatment well   Behavior During Therapy [facility] for tasks assessed/performed      Past Medical History:  Diagnosis Date  . Anxiety   . Depression   . Diabetes ([place])    type II  . Heart murmur   . HTN (hypertension)   . Hyperlipidemia   . PONV (postoperative nausea and vomiting)   . Sleep apnea   . Tinnitus     Past Surgical History:  Procedure Laterality Date  . BACK SURGERY     lower back  . COLONOSCOPY    . [place] Right    arm  . SHOULDER ARTHROSCOPY WITH OPEN ROTATOR CUFF REPAIR AND DISTAL CLAVICLE ACROMINECTOMY Right 03/06/2016   Procedure: SHOULDER ARTHROSCOPY WITH SUBACROMIAL DECOMPRESSION, DISTAL CLAVICLE RESECTION, MINI-OPEN ROTATOR CUFF REPAIR, POSSIBLE [facility] PATCH;  Surgeon: Garald Balding, MD;  Location: [place];  Service: Orthopedics;  Laterality: Right;  Needs RNFA  . WISDOM TOOTH EXTRACTION      There were no vitals filed for this visit.      Subjective Assessment - 07/24/16 1618    Subjective Pt reports MD has release him but is ok with pt continuing with PT.   Patient Stated Goals I need to be more active (get back on treadmill);  sleep all night long   Currently in Pain? Yes   Pain Score 1    Pain Location Shoulder   Pain Orientation Right   Pain Descriptors / Indicators Dull   Pain Frequency  Intermittent                         [facility] Adult PT Treatment/Exercise - 07/24/16 1615      Shoulder Exercises: Prone   Extension Both;15 reps;Weights   Extension Weight (lbs) 1   Extension Limitations "I" on green Pball   External Rotation Both;15 reps   External Rotation Limitations "W" on green Pball   Horizontal ABduction 1 Both;15 reps;Weights   Horizontal ABduction 1 Weight (lbs) 1   Horizontal ABduction 1 Limitations "T" on green Pball   Horizontal ABduction 2 Both;12 reps;Weights   Horizontal ABduction 2 Weight (lbs) 1   Horizontal ABduction 2 Limitations "Y" on green Pball     Shoulder Exercises: Standing   Flexion AROM;Both;15 reps   Flexion Limitations back against doorframe; verbal & tactile cues for scapular depression   ABduction AROM;Both;15 reps   ABduction Limitations scaption, back against doorframel; verbal & tactile cues for scapular depression     Shoulder Exercises: Therapy Ball   Flexion 15 reps   Flexion Limitations 2# cuff wt at wrist - orange Pball on wall   ABduction 15 reps   ABduction Limitations 2# cuff wt at wrist - orange Pball on wall  Shoulder Exercises: ROM/Strengthening   UBE (Upper Arm Bike) lvl 3.5 fwb/back x 3' each   Wall Wash R shoulder - 90 dg overhead arc 2# x15; flexion (2#) & scaption (no wt) x10 each   Wall Pushups 15 reps   Pushups Limitations on green Pball                PT Education - 07/24/16 1700    Education provided Yes   Education Details HEP update   Person(s) Educated Patient   Methods Explanation;Demonstration;Handout   Comprehension Verbalized understanding;Returned demonstration          PT Short Term Goals - 05/22/16 1636      PT SHORT TERM GOAL #1   Title The patient will initiate knowledge of self care strategies to promote surgical healing (precautionary motion, positioning)   05/08/16   Status Achieved     PT SHORT TERM GOAL #2   Title The patient will report a 30%  improvement in sleep   Status Achieved     PT SHORT TERM GOAL #3   Title Passive and active assisted  flexion/elevation right shoulder  to 140 degrees as needed to progress to active ROM   Status Achieved           PT Long Term Goals - 07/24/16 1619      PT LONG TERM GOAL #1   Title The patient will be independent in safe self progression of HEP needed for further improvements in ROM and strength by 08/15/16   Status On-going     PT LONG TERM GOAL #2   Title The patient will report a 60% improvement in sleep and pain with usual home /work ADLs by 06/05/16   Status Achieved     PT LONG TERM GOAL #3   Title Right shoulder flexion/elevation to 120 degrees, external rotation to 50 degrees, internal rotation to L4 region behind back needed for grooming/dressing using right UE by 08/15/16   Status Partially Met     PT LONG TERM GOAL #4   Title Right shoulder strength grossly 4-/5 or greater needed for light lifting and return to playing bass guitar by 08/15/16   Status On-going     PT LONG TERM GOAL #5   Title FOTO functional outcome score improved from 56% limitation to 35% indicating improved function with less pain by 08/15/16   Status On-going               Plan - 07/24/16 1621    Clinical Impression Statement Pt released from further follow-up with MD but to continue therapy as insurance coverage will allow. Emphasis of treatment session on reviewing exercises for HEP and willl plan to continue to progress HEP on weekly basis as only 3 insurnace visits remain for current benefit period. Pt aware of need for better compliance and follow through with HEP to maximize functional recovery of R shoulder.   Rehab Potential Good   Clinical Impairments Affecting Rehab Potential rotator cuff repair for complete tear 03/06/16   PT Treatment/Interventions ADLs/Self Care Home Management;Cryotherapy;Electrical Stimulation;Moist Heat;Therapeutic exercise;Patient/family education;Passive  range of motion;Manual techniques;Taping;Vasopneumatic Device   PT Next Visit Plan Continue R shoulder AROM & RTC strengthening per pt. tolerance updating HEP on each visit as appropriate; manual therapy to improve ROM as indicated; modalities PRN for pain   Consulted and Agree with Plan of Care Patient      Patient will benefit from skilled therapeutic intervention in order to improve the following deficits and  impairments:  Decreased range of motion, Decreased strength, Impaired perceived functional ability, Impaired UE functional use, Pain, Increased edema  Visit Diagnosis: Acute pain of right shoulder  Stiffness of right shoulder, not elsewhere classified  Muscle weakness (generalized)     Problem List Patient Active Problem List   Diagnosis Date Noted  . Obesity 07/18/2015  . OSA (obstructive sleep apnea) 06/07/2013    Percival Spanish, PT, MPT 07/24/2016, 6:21 PM  [facility] [address]  [address] [place] [place], [place], [address] Phone: [phone]   Fax:  [phone]  Name: STEFANO TRULSON MRN: [id number] Date of Birth: 17-Sep-1955

## 2016-07-31 ENCOUNTER — Ambulatory Visit: Payer: Worker's Compensation

## 2016-07-31 DIAGNOSIS — M25511 Pain in right shoulder: Secondary | ICD-10-CM | POA: Diagnosis not present

## 2016-07-31 DIAGNOSIS — M25611 Stiffness of right shoulder, not elsewhere classified: Secondary | ICD-10-CM

## 2016-07-31 DIAGNOSIS — M6281 Muscle weakness (generalized): Secondary | ICD-10-CM

## 2016-07-31 NOTE — Therapy (Signed)
[facility] [address]  [place], [place], [address] Phone: [phone]   Fax:  [phone]  Physical Therapy Treatment  Patient Details  Name: Kerry Reynolds MRN: [id number] Date of Birth: 08/26/1955 Referring Provider: Joni Fears, MD   Encounter Date: 07/31/2016      PT End of Session - 07/31/16 1624    Visit Number 21   Number of Visits 23   Date for PT Re-Evaluation 08/15/16   Authorization Type UHC 23 visit limit   PT Start Time 1617   PT Stop Time 1658   PT Time Calculation (min) 41 min   Activity Tolerance Patient tolerated treatment well   Behavior During Therapy [facility] for tasks assessed/performed      Past Medical History:  Diagnosis Date  . Anxiety   . Depression   . Diabetes ([place])    type II  . Heart murmur   . HTN (hypertension)   . Hyperlipidemia   . PONV (postoperative nausea and vomiting)   . Sleep apnea   . Tinnitus     Past Surgical History:  Procedure Laterality Date  . BACK SURGERY     lower back  . COLONOSCOPY    . [place] Right    arm  . SHOULDER ARTHROSCOPY WITH OPEN ROTATOR CUFF REPAIR AND DISTAL CLAVICLE ACROMINECTOMY Right 03/06/2016   Procedure: SHOULDER ARTHROSCOPY WITH SUBACROMIAL DECOMPRESSION, DISTAL CLAVICLE RESECTION, MINI-OPEN ROTATOR CUFF REPAIR, POSSIBLE [facility] PATCH;  Surgeon: Garald Balding, MD;  Location: [place];  Service: Orthopedics;  Laterality: Right;  Needs RNFA  . WISDOM TOOTH EXTRACTION      There were no vitals filed for this visit.      Subjective Assessment - 07/31/16 1619    Subjective Pt. doing well with updated HEP.  Still has minor pain with reaching and weakeness.    Patient Stated Goals I need to be more active (get back on treadmill);  sleep all night long   Currently in Pain? No/denies   Pain Score 0-No pain  1/10 R shoulder pain with reaching still    Multiple Pain Sites No                          [facility] Adult PT Treatment/Exercise - 07/31/16 1627      Self-Care   Self-Care Other Self-Care Comments   Other Self-Care Comments  Review of all previous HEP handouts for appropriateness; update of TB to maintain challange level     Shoulder Exercises: Prone   Extension Both;15 reps;Weights   Extension Weight (lbs) 2   Extension Limitations "I" on green Pball   External Rotation Both;15 reps   External Rotation Weight (lbs) 1   External Rotation Limitations "W" on green Pball   Horizontal ABduction 1 Both;15 reps;Weights   Horizontal ABduction 1 Weight (lbs) 1   Horizontal ABduction 1 Limitations "T" on green Pball   Horizontal ABduction 2 Both;Weights;15 reps   Horizontal ABduction 2 Weight (lbs) 1   Horizontal ABduction 2 Limitations "Y" on green Pball     Shoulder Exercises: Standing   Flexion AROM;15 reps;Right   Flexion Limitations standing on yellow TB    ABduction AROM;15 reps;Right   ABduction Limitations standing on yellow TB      Shoulder Exercises: ROM/Strengthening   UBE (Upper Arm Bike) lvl 4.0 fwb/back x 3' each   Cybex Row 15  reps   Cybex Row Limitations 35# 5 sec hold    Rhythmic Stabilization, Supine Standing wall med ball bounce (500Gr) in abduction 90 dg position x 20 sec                 PT Education - 07/31/16 1702    Education provided Yes   Education Details standing flexion and scaption with yellow TB issued to pt.,  handout for all previous HEP with inappropriate activities marked out and appropriate TB color marked on each activity.     Person(s) Educated Patient   Methods Explanation;Demonstration;Handout;Verbal cues   Comprehension Verbalized understanding;Returned demonstration;Need further instruction;Verbal cues required          PT Short Term Goals - 05/22/16 1636      PT SHORT TERM GOAL #1   Title The patient will initiate knowledge of self care strategies to promote surgical healing  (precautionary motion, positioning)   05/08/16   Status Achieved     PT SHORT TERM GOAL #2   Title The patient will report a 30% improvement in sleep   Status Achieved     PT SHORT TERM GOAL #3   Title Passive and active assisted  flexion/elevation right shoulder  to 140 degrees as needed to progress to active ROM   Status Achieved           PT Long Term Goals - 07/24/16 1619      PT LONG TERM GOAL #1   Title The patient will be independent in safe self progression of HEP needed for further improvements in ROM and strength by 08/15/16   Status On-going     PT LONG TERM GOAL #2   Title The patient will report a 60% improvement in sleep and pain with usual home /work ADLs by 06/05/16   Status Achieved     PT LONG TERM GOAL #3   Title Right shoulder flexion/elevation to 120 degrees, external rotation to 50 degrees, internal rotation to L4 region behind back needed for grooming/dressing using right UE by 08/15/16   Status Partially Met     PT LONG TERM GOAL #4   Title Right shoulder strength grossly 4-/5 or greater needed for light lifting and return to playing bass guitar by 08/15/16   Status On-going     PT LONG TERM GOAL #5   Title FOTO functional outcome score improved from 56% limitation to 35% indicating improved function with less pain by 08/15/16   Status On-going               Plan - 07/31/16 1630    Clinical Impression Statement Pt. reporting no issues with updated HEP.  Still has mild pain with reaching activities however feeling good today.  HEP updated to include flexion, scaption with yellow TB which pt. was able to perform without issue.  All previous HEP printed out for pt. today and reviewed for appropriateness.  TB levels updated to maintain appropriate challenge level for pt.  Pt. verbalizing understanding of updated HEP.   PT Treatment/Interventions ADLs/Self Care Home Management;Cryotherapy;Electrical Stimulation;Moist Heat;Therapeutic exercise;Patient/family  education;Passive range of motion;Manual techniques;Taping;Vasopneumatic Device   PT Next Visit Plan Continue R shoulder AROM & RTC strengthening per pt. tolerance updating HEP on each visit as appropriate; manual therapy to improve ROM as indicated; modalities PRN for pain      Patient will benefit from skilled therapeutic intervention in order to improve the following deficits and impairments:  Decreased range of motion, Decreased strength, Impaired perceived  functional ability, Impaired UE functional use, Pain, Increased edema  Visit Diagnosis: Acute pain of right shoulder  Stiffness of right shoulder, not elsewhere classified  Muscle weakness (generalized)     Problem List Patient Active Problem List   Diagnosis Date Noted  . Obesity 07/18/2015  . OSA (obstructive sleep apnea) 06/07/2013    Kerry Reynolds, PTA 07/31/16 6:18 PM  [facility] [address]  [place], [place], [address] Phone: [phone]   Fax:  [phone]  Name: Kerry Reynolds MRN: [id number] Date of Birth: [birth year]

## 2016-08-07 ENCOUNTER — Ambulatory Visit: Payer: No Typology Code available for payment source | Attending: Orthopaedic Surgery

## 2016-08-07 DIAGNOSIS — M25611 Stiffness of right shoulder, not elsewhere classified: Secondary | ICD-10-CM

## 2016-08-07 DIAGNOSIS — M6281 Muscle weakness (generalized): Secondary | ICD-10-CM

## 2016-08-07 DIAGNOSIS — M25511 Pain in right shoulder: Secondary | ICD-10-CM | POA: Insufficient documentation

## 2016-08-07 NOTE — Therapy (Signed)
[facility] [address]  [place], [place], [address] Phone: [phone]   Fax:  [phone]  Physical Therapy Treatment  Patient Details  Name: Kerry Reynolds MRN: [id number] Date of Birth: May 21, 1955 Referring Provider: Joni Fears, MD   Encounter Date: 08/07/2016      PT End of Session - 08/07/16 1620    Visit Number 22   Number of Visits 23   Date for PT Re-Evaluation 08/15/16   Authorization Type UHC 23 visit limit   PT Start Time 1616   PT Stop Time 1659   PT Time Calculation (min) 43 min   Activity Tolerance Patient tolerated treatment well   Behavior During Therapy [facility] for tasks assessed/performed      Past Medical History:  Diagnosis Date  . Anxiety   . Depression   . Diabetes ([place])    type II  . Heart murmur   . HTN (hypertension)   . Hyperlipidemia   . PONV (postoperative nausea and vomiting)   . Sleep apnea   . Tinnitus     Past Surgical History:  Procedure Laterality Date  . BACK SURGERY     lower back  . COLONOSCOPY    . [place] Right    arm  . SHOULDER ARTHROSCOPY WITH OPEN ROTATOR CUFF REPAIR AND DISTAL CLAVICLE ACROMINECTOMY Right 03/06/2016   Procedure: SHOULDER ARTHROSCOPY WITH SUBACROMIAL DECOMPRESSION, DISTAL CLAVICLE RESECTION, MINI-OPEN ROTATOR CUFF REPAIR, POSSIBLE [facility] PATCH;  Surgeon: Garald Balding, MD;  Location: [place];  Service: Orthopedics;  Laterality: Right;  Needs RNFA  . WISDOM TOOTH EXTRACTION      There were no vitals filed for this visit.      Subjective Assessment - 08/07/16 1814    Subjective Pt. doing well with HEP however most difficult activity is still prone on ball.     Patient Stated Goals I need to be more active (get back on treadmill);  sleep all night long   Currently in Pain? No/denies   Pain Score 0-No pain   Multiple Pain Sites No                         [facility] Adult PT  Treatment/Exercise - 08/07/16 1636      Shoulder Exercises: Standing   Flexion AROM;15 reps;Both;Theraband   Flexion Limitations standing on red TB    ABduction AROM;15 reps;Theraband;Both   ABduction Limitations standing on red TB      Shoulder Exercises: ROM/Strengthening   UBE (Upper Arm Bike) lvl 4.5 fwb/back x 3' each   Cybex Row 15 reps   Cybex Row Limitations 35# 5 sec hold   wide grip   Wall Pushups 15 reps   Pushups Limitations at counter    Proximal Shoulder Strengthening, Supine BOSU ball (down) pushup position from knee with side<>side rocking x 20 reps each way   Rhythmic Stabilization, Supine Standing wall med ball bounce (1000Gr) in abduction 90 dg position 2 x 30 sec      Shoulder Exercises: Body Blade   Flexion 30 seconds   Flexion Limitations B UE   External Rotation 30 seconds;1 rep   External Rotation Limitations R; neutral    Internal Rotation 30 seconds   Internal Rotation Limitations R; neutral                 PT Education - 08/07/16 1656  Education provided Yes   Education Details blue TB issue to pt. for row, red TB issued to pt. for standing flexion, scaption, counter push up   Person(s) Educated Patient   Methods Explanation;Demonstration;Verbal cues;Handout   Comprehension Verbalized understanding;Returned demonstration;Verbal cues required;Need further instruction          PT Short Term Goals - 05/22/16 1636      PT SHORT TERM GOAL #1   Title The patient will initiate knowledge of self care strategies to promote surgical healing (precautionary motion, positioning)   05/08/16   Status Achieved     PT SHORT TERM GOAL #2   Title The patient will report a 30% improvement in sleep   Status Achieved     PT SHORT TERM GOAL #3   Title Passive and active assisted  flexion/elevation right shoulder  to 140 degrees as needed to progress to active ROM   Status Achieved           PT Long Term Goals - 07/24/16 1619      PT LONG TERM  GOAL #1   Title The patient will be independent in safe self progression of HEP needed for further improvements in ROM and strength by 08/15/16   Status On-going     PT LONG TERM GOAL #2   Title The patient will report a 60% improvement in sleep and pain with usual home /work ADLs by 06/05/16   Status Achieved     PT LONG TERM GOAL #3   Title Right shoulder flexion/elevation to 120 degrees, external rotation to 50 degrees, internal rotation to L4 region behind back needed for grooming/dressing using right UE by 08/15/16   Status Partially Met     PT LONG TERM GOAL #4   Title Right shoulder strength grossly 4-/5 or greater needed for light lifting and return to playing bass guitar by 08/15/16   Status On-going     PT LONG TERM GOAL #5   Title FOTO functional outcome score improved from 56% limitation to 35% indicating improved function with less pain by 08/15/16   Status On-going               Plan - 08/07/16 1623    Clinical Impression Statement Pt. doing well with updated HEP however still feels prone physio ball activities challenge him the most.  Discussion with pt. today regarding anticipated upcoming d/c with pt. verbalizing he feels he will be comfortable transitioning to home program following next visit.  HEP activities updated to include more challenging resistance bands and counter pushup added with pt. able to perform well with only mild discomfort.  Still some cueing required for proper technique with overhead band IR/ER today.  Overhead shoulder stabilization activities progressed today and tolerated well.  Will plan for final HEP update next.     PT Treatment/Interventions ADLs/Self Care Home Management;Cryotherapy;Electrical Stimulation;Moist Heat;Therapeutic exercise;Patient/family education;Passive range of motion;Manual techniques;Taping;Vasopneumatic Device   PT Next Visit Plan pt. anticipating d/c       Patient will benefit from skilled therapeutic intervention in  order to improve the following deficits and impairments:  Decreased range of motion, Decreased strength, Impaired perceived functional ability, Impaired UE functional use, Pain, Increased edema  Visit Diagnosis: Acute pain of right shoulder  Stiffness of right shoulder, not elsewhere classified  Muscle weakness (generalized)     Problem List Patient Active Problem List   Diagnosis Date Noted  . Obesity 07/18/2015  . OSA (obstructive sleep apnea) 06/07/2013    Kerry Reynolds  [place], [place] 08/08/16 1:50 PM   [facility] [address]  [place], [place], [address] Phone: [phone]   Fax:  [phone]  Name: Kerry Reynolds MRN: [id number] Date of Birth: 03-28-1956

## 2016-08-14 ENCOUNTER — Ambulatory Visit: Payer: No Typology Code available for payment source

## 2016-08-14 DIAGNOSIS — M25511 Pain in right shoulder: Secondary | ICD-10-CM | POA: Diagnosis not present

## 2016-08-14 DIAGNOSIS — M6281 Muscle weakness (generalized): Secondary | ICD-10-CM

## 2016-08-14 DIAGNOSIS — M25611 Stiffness of right shoulder, not elsewhere classified: Secondary | ICD-10-CM

## 2016-08-14 NOTE — Therapy (Signed)
[facility] [address]  [place], [place], [address] Phone: [phone]   Fax:  [phone]  Physical Therapy Treatment  Patient Details  Name: Kerry Reynolds MRN: [id number] Date of Birth: 1955/04/28 Referring Provider: Joni Fears, MD   Encounter Date: 08/14/2016      PT End of Session - 08/14/16 1622    Visit Number 23   Number of Visits 23   Date for PT Re-Evaluation 08/15/16   Authorization Type [employer] 23 visit limit   PT Start Time 1616   PT Stop Time 1700   PT Time Calculation (min) 44 min   Activity Tolerance Patient tolerated treatment well   Behavior During Therapy [facility] for tasks assessed/performed      Past Medical History:  Diagnosis Date  . Anxiety   . Depression   . Diabetes ([place])    type II  . Heart murmur   . HTN (hypertension)   . Hyperlipidemia   . PONV (postoperative nausea and vomiting)   . Sleep apnea   . Tinnitus     Past Surgical History:  Procedure Laterality Date  . BACK SURGERY     lower back  . COLONOSCOPY    . [place] Right    arm  . SHOULDER ARTHROSCOPY WITH OPEN ROTATOR CUFF REPAIR AND DISTAL CLAVICLE ACROMINECTOMY Right 03/06/2016   Procedure: SHOULDER ARTHROSCOPY WITH SUBACROMIAL DECOMPRESSION, DISTAL CLAVICLE RESECTION, MINI-OPEN ROTATOR CUFF REPAIR, POSSIBLE [facility] PATCH;  Surgeon: Garald Balding, MD;  Location: [place];  Service: Orthopedics;  Laterality: Right;  Needs RNFA  . WISDOM TOOTH EXTRACTION      There were no vitals filed for this visit.      Subjective Assessment - 08/14/16 1621    Subjective Pt. noting less pain with daily activities however still has pain with reaching behind back and behind head.  Ball HEP activities are getting easier.     Patient Stated Goals I need to be more active (get back on treadmill);  sleep all night long   Currently in Pain? Yes   Pain Score 1    Pain Location Shoulder   Pain Orientation Right   Pain Type Surgical pain   Multiple Pain Sites No            [facility] PT Assessment - 08/14/16 1623      AROM   Right/Left Shoulder Right;Left   Right Shoulder Flexion 148 Degrees  sitting; mild pain; 155 dg supine    Right Shoulder ABduction 128 Degrees  sitting; mild pain at end range; 141 dg supine    Right Shoulder Internal Rotation 81 Degrees  FIR to T10   Right Shoulder External Rotation 75 Degrees     Strength   Right Shoulder Flexion 4/5   Right Shoulder ABduction 4-/5  pain with max level resistance    Right Shoulder Internal Rotation 4/5   Right Shoulder External Rotation 4/5                     [facility] Adult PT Treatment/Exercise - 08/14/16 1644      Self-Care   Self-Care Other Self-Care Comments   Other Self-Care Comments  Discussion of current HEP activities for appropriateness      Shoulder Exercises: Prone   Extension Weights;Both;5 reps  to check technique '   Extension Weight (lbs) 2   Extension Limitations "I" on green Pball  External Rotation Both;5 reps  to check technique    External Rotation Limitations "W" on green Pball   Horizontal ABduction 1 Both;Weights;5 reps  to check technique    Horizontal ABduction 1 Limitations "T" on green Pball   Horizontal ABduction 2 Both;Weights;5 reps  to check technique    Horizontal ABduction 2 Limitations "Y" on green Pball     Shoulder Exercises: Standing   Flexion Both;Theraband;5 reps;Strengthening   Flexion Limitations standing on red TB   to check technique    ABduction Both;Theraband;Strengthening;5 reps  to check technique    ABduction Limitations standing on red TB   abduction/scaption      Shoulder Exercises: ROM/Strengthening   UBE (Upper Arm Bike) lvl 4.5 fwb/back x 3' each   Wall Pushups 15 reps  to check technique    Pushups Limitations at counter      Shoulder Exercises: Stretch   Internal Rotation Stretch 30 seconds   Internal Rotation Stretch  Limitations with towel                 PT Education - 08/14/16 1832    Education provided Yes   Education Details internal rotation stretch with towel    Person(s) Educated Patient   Methods Explanation;Demonstration;Verbal cues;Handout   Comprehension Verbalized understanding;Returned demonstration;Verbal cues required;Need further instruction          PT Short Term Goals - 08/14/16 1631      PT SHORT TERM GOAL #1   Title The patient will initiate knowledge of self care strategies to promote surgical healing (precautionary motion, positioning)   05/08/16   Status Achieved     PT SHORT TERM GOAL #2   Title The patient will report a 30% improvement in sleep   Status Achieved     PT SHORT TERM GOAL #3   Title Passive and active assisted  flexion/elevation right shoulder  to 140 degrees as needed to progress to active ROM   Status Achieved           PT Long Term Goals - 08/14/16 1632      PT LONG TERM GOAL #1   Title The patient will be independent in safe self progression of HEP needed for further improvements in ROM and strength by 08/15/16   Status Achieved  5.10.18: pt. verbalizing understanding of safe progression of band resistance level and need to continue cautious progression      PT LONG TERM GOAL #2   Title The patient will report a 60% improvement in sleep and pain with usual home /work ADLs by 06/05/16   Status Achieved     PT LONG TERM GOAL #3   Title Right shoulder flexion/elevation to 120 degrees, external rotation to 50 degrees, internal rotation to L4 region behind back needed for grooming/dressing using right UE by 08/15/16   Status Achieved     PT LONG TERM GOAL #4   Title Right shoulder strength grossly 4-/5 or greater needed for light lifting and return to playing bass guitar by 08/15/16   Status Achieved     PT LONG TERM GOAL #5   Title FOTO functional outcome score improved from 56% limitation to 35% indicating improved function with less  pain by 08/15/16   Status Unable to assess  5.10.18: unable to assess due to technical difficulties preventing production of FOTO score                Plan - 08/14/16 1653    Clinical Impression Statement  Pt. returning to therapy for last visit in [place].  Pt. unable to continue with therapy due to insurance coverage limitations.  Pt. able to meet strength and AROM goal today and reports less pain with ADL's and work related duties.  Pt. still with notable weakness in overhead motions and still does not have full AROM of R shoulder as compared to L.  Still some pain with reaching behind head and back.  HEP updated to include stretches to continue ROM progression and previous HEP reviewed with pt. for appropriateness.  Pt. verbalizing he feels comfortable transitioning to home program and may seek out further treatment at [facility] to continue strengthening R shoulder and further prevention of re-injury.     PT Treatment/Interventions ADLs/Self Care Home Management;Cryotherapy;Electrical Stimulation;Moist Heat;Therapeutic exercise;Patient/family education;Passive range of motion;Manual techniques;Taping;Vasopneumatic Device   PT Next Visit Plan d/c      Patient will benefit from skilled therapeutic intervention in order to improve the following deficits and impairments:  Decreased range of motion, Decreased strength, Impaired perceived functional ability, Impaired UE functional use, Pain, Increased edema  Visit Diagnosis: Acute pain of right shoulder  Stiffness of right shoulder, not elsewhere classified  Muscle weakness (generalized)     Problem List Patient Active Problem List   Diagnosis Date Noted  . Obesity 07/18/2015  . OSA (obstructive sleep apnea) 06/07/2013    Bess Harvest, PTA 08/15/16 9:14 AM  [facility] [address]  [place], [place], [address] Phone: [phone]   Fax:   [phone]  Name: Kerry Reynolds MRN: [id number] Date of Birth: [birth year]   PHYSICAL THERAPY DISCHARGE SUMMARY  Visits from Start of Care: 23  Current functional level related to goals / functional outcomes:   Pt has demonstrated good progress with PT with pt now with functional AROM of R shoulder. Strength improved, but remains limited in overhead ranges. Pt independent with HEP for continued strengthening. All current goals met and pt being discharged due to exhausting insurance coverage. Pt provided info for local pro bono clinic if he does not feel he continues to make gains with his HEP.   Remaining deficits:   As above.   Education / Equipment:   HEP  Plan: Patient agrees to discharge.  Patient goals were met. Patient is being discharged due to financial reasons.  ?????       Percival Spanish, PT, MPT 08/15/16, 9:24 AM  [facility] [address]  [place], [place], [address] Phone: [phone]   Fax:  [phone]

## 2016-10-02 DIAGNOSIS — I10 Essential (primary) hypertension: Secondary | ICD-10-CM | POA: Diagnosis not present

## 2016-10-02 DIAGNOSIS — Z1159 Encounter for screening for other viral diseases: Secondary | ICD-10-CM | POA: Diagnosis not present

## 2016-10-02 DIAGNOSIS — E119 Type 2 diabetes mellitus without complications: Secondary | ICD-10-CM | POA: Diagnosis not present

## 2016-10-02 DIAGNOSIS — E7800 Pure hypercholesterolemia, unspecified: Secondary | ICD-10-CM | POA: Diagnosis not present

## 2016-10-16 DIAGNOSIS — H5213 Myopia, bilateral: Secondary | ICD-10-CM | POA: Diagnosis not present

## 2016-10-16 DIAGNOSIS — H40053 Ocular hypertension, bilateral: Secondary | ICD-10-CM | POA: Diagnosis not present

## 2016-10-16 DIAGNOSIS — E119 Type 2 diabetes mellitus without complications: Secondary | ICD-10-CM | POA: Diagnosis not present

## 2016-10-16 DIAGNOSIS — H52223 Regular astigmatism, bilateral: Secondary | ICD-10-CM | POA: Diagnosis not present

## 2017-02-03 DIAGNOSIS — Z23 Encounter for immunization: Secondary | ICD-10-CM | POA: Diagnosis not present

## 2017-02-03 DIAGNOSIS — M549 Dorsalgia, unspecified: Secondary | ICD-10-CM | POA: Diagnosis not present

## 2017-02-16 ENCOUNTER — Ambulatory Visit (INDEPENDENT_AMBULATORY_CARE_PROVIDER_SITE_OTHER): Payer: Self-pay | Admitting: Orthopaedic Surgery

## 2017-02-17 ENCOUNTER — Encounter (INDEPENDENT_AMBULATORY_CARE_PROVIDER_SITE_OTHER): Payer: Self-pay

## 2017-02-17 ENCOUNTER — Encounter (INDEPENDENT_AMBULATORY_CARE_PROVIDER_SITE_OTHER): Payer: Self-pay | Admitting: Orthopaedic Surgery

## 2017-02-17 ENCOUNTER — Ambulatory Visit (INDEPENDENT_AMBULATORY_CARE_PROVIDER_SITE_OTHER): Payer: 59 | Admitting: Orthopaedic Surgery

## 2017-02-17 ENCOUNTER — Ambulatory Visit (INDEPENDENT_AMBULATORY_CARE_PROVIDER_SITE_OTHER): Payer: 59

## 2017-02-17 VITALS — BP 130/79 | HR 115 | Resp 14 | Ht 73.0 in | Wt 245.0 lb

## 2017-02-17 DIAGNOSIS — M542 Cervicalgia: Secondary | ICD-10-CM

## 2017-02-17 NOTE — Progress Notes (Signed)
Office Visit Note   Patient: Kerry Reynolds           Date of Birth: [birth year]           MRN: [id number] Visit Date: 02/17/2017              Requested by: Elias Elseeade, Robert, MD [phone] [address] [place], [place] [id number] PCP: Elias Elseeade, Robert, MD   Assessment & Plan: Visit Diagnoses:  1. Bilateral neck pain   2. 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 Has prescriptions for hydrocodone, Flexeril and Naprosyn from his primary care physician. We discussed those medicines in detail. He should use the hydrocodone as a last resort. We'll give him a set of cervical spine exercises. He could try ice or heat. If he doesn't have much relief over the next 4-6 weeks I would suggest an MRI scan. He may be a candidate for cervical spine injections  Follow-Up Instructions: Return if symptoms worsen or fail to improve.   Orders:  Orders Placed This Encounter  Procedures  . XR Cervical Spine 2 or 3 views   No orders of the defined types were placed in this encounter.     Procedures: No procedures performed   Clinical Data: No additional findings.   Subjective: Chief Complaint  Patient presents with  . Middle Back - Pain, Numbness    Kerry Reynolds is a 61 y o here today for upper back pain x 3 weeks. Pain started in his neck and radiates to R hand and fingers. No injury  Kerry Reynolds relates that his pain is relatively localized to the cervical spine with referred discomfort into the intrascapular region. I rare occasion he'll have some discomfort between his right elbow and right wrist. No numbness or tingling. Kerry Reynolds. Denies any trouble with swallowing or voice changes. He also was not having any trouble with either shoulder. He's had a prior right shoulder rotator cuff tear repair and doing well from that standpoint. He has seen his primary care physician who has prescribed Naprosyn, Flexeril and  hydrocodone  HPI  Review of Systems  Constitutional: Negative for fatigue.  HENT: Negative for hearing loss.   Respiratory: Negative for apnea, chest tightness and shortness of breath.   Cardiovascular: Negative for chest pain, palpitations and leg swelling.  Gastrointestinal: Negative for blood in stool, constipation and diarrhea.  Genitourinary: Negative for difficulty urinating.  Musculoskeletal: Positive for back pain, neck pain and neck stiffness. Negative for arthralgias, joint swelling and myalgias.  Neurological: Negative for weakness, numbness and Reynolds.  Hematological: Does not bruise/bleed easily.  Psychiatric/Behavioral: Positive for sleep disturbance. The patient is not nervous/anxious.      Objective: Vital Signs: BP 130/79   Pulse (!) 115   Resp 14   Ht 6\' 1"  (1.854 m)   Wt 245 lb (111.1 kg)   BMI 32.32 kg/m   Physical Exam  Ortho Exam awake alert and oriented 3. Comfortable sitting. Minimal loss of cervical spine motion in all planes. Some discomfort in the intrascapular region with neck extension. No pain with neck flexion. No referred pain to either shoulder or either upper extremity with any motion. Good grip and good release. No shoulder pain. Painless motion of both shoulders. Negative impingement. No localized areas of tenderness in the intrascapular region. No masses palpated in the cervical spine  Specialty Comments:  No specialty comments available.  Imaging: Xr Cervical Spine  2 Or 3 Views  Result Date: 02/17/2017 Spine obtained in several projections. There is considerable degenerative disc disease at C5-6, C6-7 and C7-T1. There is also considerable calcification of the cricothyroid. I did check with one of the radiologists and notes that that is probably not pathologic. There were no symptoms referable to the front of the cervical spine. 90, consistent with considerable arthritis in the lower cervical spine    PMFS History: Patient Active  Problem List   Diagnosis Date Noted  . Obesity 07/18/2015  . OSA (obstructive sleep apnea) 06/07/2013   Past Medical History:  Diagnosis Date  . Anxiety   . Depression   . Diabetes ([facility])    type II  . Heart murmur   . HTN (hypertension)   . Hyperlipidemia   . PONV (postoperative nausea and vomiting)   . Sleep apnea   . Tinnitus     Family History  Problem Relation Age of Onset  . Breast cancer Mother   . Diabetes Maternal Grandmother     Past Surgical History:  Procedure Laterality Date  . BACK SURGERY     lower back  . COLONOSCOPY    . HERNIA REPAIR     [employer]  . LACERATION REPAIR Right    arm  . WISDOM TOOTH EXTRACTION     Social History   Occupational History  . Occupation: Runner, broadcasting/film/videoteacher  Tobacco Use  . Smoking status: Never Smoker  . Smokeless tobacco: Never Used  Substance and Sexual Activity  . Alcohol use: No  . Drug use: No  . Sexual activity: No

## 2017-02-17 NOTE — Patient Instructions (Signed)
Neck Exercises Neck exercises can be important for many reasons:  They can help you to improve and maintain flexibility in your neck. This can be especially important as you age.  They can help to make your neck stronger. This can make movement easier.  They can reduce or prevent neck pain.  They may help your upper back.  Cervical Radiculopathy Cervical radiculopathy happens when a nerve in the neck (cervical nerve) is pinched or bruised. This condition can develop because of an injury or as part of the normal aging process. Pressure on the cervical nerves can cause pain or numbness that runs from the neck all the way down into the arm and fingers. Usually, this condition gets better with rest. Treatment may be needed if the condition does not improve. What are the causes? This condition may be caused by: Injury. Slipped (herniated) disk. Muscle tightness in the neck because of overuse. Arthritis. Breakdown or degeneration in the bones and joints of the spine (spondylosis) due to aging. Bone spurs that may develop near the cervical nerves.  What are the signs or symptoms? Symptoms of this condition include: Pain that runs from the neck to the arm and hand. The pain can be severe or irritating. It may be worse when the neck is moved. Numbness or weakness in the affected arm and hand.  How is this diagnosed? This condition may be diagnosed based on symptoms, medical history, and a physical exam. You may also have tests, including: X-rays. CT scan. MRI. Electromyogram (EMG). Nerve conduction tests.  How is this treated? In many cases, treatment is not needed for this condition. With rest, the condition usually gets better over time. If treatment is needed, options may include: Wearing a soft neck collar for short periods of time. Physical therapy to strengthen your neck muscles. Medicines, such as NSAIDs, oral corticosteroids, or spinal injections. Surgery. This may be needed if  other treatments do not help. Various types of surgery may be done depending on the cause of your problems.  Follow these instructions at home: Managing pain Take over-the-counter and prescription medicines only as told by your health care provider. If directed, apply ice to the affected area. Put ice in a plastic bag. Place a towel between your skin and the bag. Leave the ice on for 20 minutes, 2-3 times per day. If ice does not help, you can try using heat. Take a warm shower or warm bath, or use a heat pack as told by your health care provider. Try a gentle neck and shoulder massage to help relieve symptoms. Activity Rest as needed. Follow instructions from your health care provider about any restrictions on activities. Do stretching and strengthening exercises as told by your health care provider or physical therapist. General instructions If you were given a soft collar, wear it as told by your health care provider. Use a flat pillow when you sleep. Keep all follow-up visits as told by your health care provider. This is important. Contact a health care provider if: Your condition does not improve with treatment. Get help right away if: Your pain gets much worse and cannot be controlled with medicines. You have weakness or numbness in your hand, arm, face, or leg. You have a high fever. You have a stiff, rigid neck. You lose control of your bowels or your bladder (have incontinence). You have trouble with walking, balance, or speaking. This information is not intended to replace advice given to you by your health care provider. Make  sure you discuss any questions you have with your health care provider. Document Released: 12/17/2000 Document Revised: 08/30/2015 Document Reviewed: 05/18/2014 Elsevier Interactive Patient Education  2018 [employer] Ask your health care provider which neck exercises would be best for you. Exercises Neck Press Repeat this exercise 10 times. Do it  first thing in the morning and right before bed or as told by your health care provider. 1. Lie on your back on a firm bed or on the floor with a pillow under your head. 2. Use your neck muscles to push your head down on the pillow and straighten your spine. 3. Hold the position as well as you can. Keep your head facing up and your chin tucked. 4. Slowly count to 5 while holding this position. 5. Relax for a few seconds. Then repeat.  Isometric Strengthening Do a full set of these exercises 2 times a day or as told by your health care provider. 1. Sit in a supportive chair and place your hand on your forehead. 2. Push forward with your head and neck while pushing back with your hand. Hold for 10 seconds. 3. Relax. Then repeat the exercise 3 times. 4. Next, do thesequence again, this time putting your hand against the back of your head. Use your head and neck to push backward against the hand pressure. 5. Finally, do the same exercise on either side of your head, pushing sideways against the pressure of your hand.  Prone Head Lifts Repeat this exercise 5 times. Do this 2 times a day or as told by your health care provider. 1. Lie face-down, resting on your elbows so that your chest and upper back are raised. 2. Start with your head facing downward, near your chest. Position your chin either on or near your chest. 3. Slowly lift your head upward. Lift until you are looking straight ahead. Then continue lifting your head as far back as you can stretch. 4. Hold your head up for 5 seconds. Then slowly lower it to your starting position.  Supine Head Lifts Repeat this exercise 8-10 times. Do this 2 times a day or as told by your health care provider. 1. Lie on your back, bending your knees to point to the ceiling and keeping your feet flat on the floor. 2. Lift your head slowly off the floor, raising your chin toward your chest. 3. Hold for 5 seconds. 4. Relax and repeat.  Scapular  Retraction Repeat this exercise 5 times. Do this 2 times a day or as told by your health care provider. 1. Stand with your arms at your sides. Look straight ahead. 2. Slowly pull both shoulders backward and downward until you feel a stretch between your shoulder blades in your upper back. 3. Hold for 10-30 seconds. 4. Relax and repeat.  Contact a health care provider if:  Your neck pain or discomfort gets much worse when you do an exercise.  Your neck pain or discomfort does not improve within 2 hours after you exercise. If you have any of these problems, stop exercising right away. Do not do the exercises again unless your health care provider says that you can. Get help right away if:  You develop sudden, severe neck pain. If this happens, stop exercising right away. Do not do the exercises again unless your health care provider says that you can. Exercises Neck Stretch  Repeat this exercise 3-5 times. 1. Do this exercise while standing or while sitting in a chair. 2. Place your  feet flat on the floor, shoulder-width apart. 3. Slowly turn your head to the right. Turn it all the way to the right so you can look over your right shoulder. Do not tilt or tip your head. 4. Hold this position for 10-30 seconds. 5. Slowly turn your head to the left, to look over your left shoulder. 6. Hold this position for 10-30 seconds.  Neck Retraction Repeat this exercise 8-10 times. Do this 3-4 times a day or as told by your health care provider. 1. Do this exercise while standing or while sitting in a sturdy chair. 2. Look straight ahead. Do not bend your neck. 3. Use your fingers to push your chin backward. Do not bend your neck for this movement. Continue to face straight ahead. If you are doing the exercise properly, you will feel a slight sensation in your throat and a stretch at the back of your neck. 4. Hold the stretch for 1-2 seconds. Relax and repeat.  This information is not intended to  replace advice given to you by your health care provider. Make sure you discuss any questions you have with your health care provider. Document Released: 03/05/2015 Document Revised: 08/30/2015 Document Reviewed: 10/02/2014 Elsevier Interactive Patient Education  2017 [employer]

## 2017-04-15 DIAGNOSIS — I10 Essential (primary) hypertension: Secondary | ICD-10-CM | POA: Diagnosis not present

## 2017-04-15 DIAGNOSIS — E119 Type 2 diabetes mellitus without complications: Secondary | ICD-10-CM | POA: Diagnosis not present

## 2017-04-15 DIAGNOSIS — E7800 Pure hypercholesterolemia, unspecified: Secondary | ICD-10-CM | POA: Diagnosis not present

## 2017-05-12 DIAGNOSIS — H9392 Unspecified disorder of left ear: Secondary | ICD-10-CM | POA: Diagnosis not present

## 2017-05-12 DIAGNOSIS — H6123 Impacted cerumen, bilateral: Secondary | ICD-10-CM | POA: Diagnosis not present

## 2017-05-12 DIAGNOSIS — H903 Sensorineural hearing loss, bilateral: Secondary | ICD-10-CM | POA: Diagnosis not present

## 2017-07-14 DIAGNOSIS — H90A32 Mixed conductive and sensorineural hearing loss, unilateral, left ear with restricted hearing on the contralateral side: Secondary | ICD-10-CM | POA: Diagnosis not present

## 2017-07-14 DIAGNOSIS — H90A21 Sensorineural hearing loss, unilateral, right ear, with restricted hearing on the contralateral side: Secondary | ICD-10-CM | POA: Diagnosis not present

## 2017-07-14 DIAGNOSIS — G4733 Obstructive sleep apnea (adult) (pediatric): Secondary | ICD-10-CM | POA: Diagnosis not present

## 2017-07-14 DIAGNOSIS — H9313 Tinnitus, bilateral: Secondary | ICD-10-CM | POA: Diagnosis not present

## 2017-07-14 DIAGNOSIS — H6993 Unspecified Eustachian tube disorder, bilateral: Secondary | ICD-10-CM | POA: Insufficient documentation

## 2017-07-14 DIAGNOSIS — H9193 Unspecified hearing loss, bilateral: Secondary | ICD-10-CM | POA: Diagnosis not present

## 2017-07-14 DIAGNOSIS — H9311 Tinnitus, right ear: Secondary | ICD-10-CM | POA: Diagnosis not present

## 2017-09-07 DIAGNOSIS — I10 Essential (primary) hypertension: Secondary | ICD-10-CM | POA: Diagnosis not present

## 2017-09-07 DIAGNOSIS — Z0000 Encounter for general adult medical examination without abnormal findings: Secondary | ICD-10-CM | POA: Diagnosis not present

## 2017-09-07 DIAGNOSIS — E7800 Pure hypercholesterolemia, unspecified: Secondary | ICD-10-CM | POA: Diagnosis not present

## 2017-09-07 DIAGNOSIS — E1169 Type 2 diabetes mellitus with other specified complication: Secondary | ICD-10-CM | POA: Diagnosis not present

## 2018-01-27 DIAGNOSIS — E119 Type 2 diabetes mellitus without complications: Secondary | ICD-10-CM | POA: Diagnosis not present

## 2018-02-24 DIAGNOSIS — Z23 Encounter for immunization: Secondary | ICD-10-CM | POA: Diagnosis not present

## 2020-10-01 ENCOUNTER — Other Ambulatory Visit: Payer: Self-pay | Admitting: Family Medicine

## 2020-10-01 DIAGNOSIS — Z9889 Other specified postprocedural states: Secondary | ICD-10-CM

## 2020-10-01 DIAGNOSIS — M5440 Lumbago with sciatica, unspecified side: Secondary | ICD-10-CM

## 2020-10-01 DIAGNOSIS — M5416 Radiculopathy, lumbar region: Secondary | ICD-10-CM

## 2020-10-10 ENCOUNTER — Other Ambulatory Visit: Payer: Self-pay

## 2020-10-10 ENCOUNTER — Ambulatory Visit
Admission: RE | Admit: 2020-10-10 | Discharge: 2020-10-10 | Disposition: A | Discharge status: Home / Self Care | Payer: No Typology Code available for payment source | Source: Ambulatory Visit | Attending: Family Medicine | Admitting: Family Medicine

## 2020-10-10 DIAGNOSIS — Z9889 Other specified postprocedural states: Secondary | ICD-10-CM

## 2020-10-10 DIAGNOSIS — M5440 Lumbago with sciatica, unspecified side: Secondary | ICD-10-CM

## 2020-10-10 DIAGNOSIS — M5416 Radiculopathy, lumbar region: Secondary | ICD-10-CM

## 2022-01-20 DIAGNOSIS — H5213 Myopia, bilateral: Secondary | ICD-10-CM | POA: Diagnosis not present

## 2022-01-20 DIAGNOSIS — H524 Presbyopia: Secondary | ICD-10-CM | POA: Diagnosis not present

## 2022-01-20 DIAGNOSIS — H52223 Regular astigmatism, bilateral: Secondary | ICD-10-CM | POA: Diagnosis not present

## 2022-01-20 DIAGNOSIS — H53143 Visual discomfort, bilateral: Secondary | ICD-10-CM | POA: Diagnosis not present

## 2022-01-30 DIAGNOSIS — E1169 Type 2 diabetes mellitus with other specified complication: Secondary | ICD-10-CM | POA: Diagnosis not present

## 2022-01-30 DIAGNOSIS — G4733 Obstructive sleep apnea (adult) (pediatric): Secondary | ICD-10-CM | POA: Diagnosis not present

## 2022-01-30 DIAGNOSIS — Z23 Encounter for immunization: Secondary | ICD-10-CM | POA: Diagnosis not present

## 2022-01-30 DIAGNOSIS — E7800 Pure hypercholesterolemia, unspecified: Secondary | ICD-10-CM | POA: Diagnosis not present

## 2022-01-30 DIAGNOSIS — I10 Essential (primary) hypertension: Secondary | ICD-10-CM | POA: Diagnosis not present

## 2023-03-28 IMAGING — MR MR LUMBAR SPINE W/O CM
4 of 5 series · 18 of 48 positions shown · non-contrast
Comparison: None.

CLINICAL DATA: Low back pain radiating into the left leg for 1
month. No known injury.

EXAM:
MRI LUMBAR SPINE WITHOUT CONTRAST
TECHNIQUE: Multiplanar, multisequence MR imaging of the lumbar spine was
performed. No intravenous contrast was administered.

[Series 9: T2 · sagittal · 4.0mm · 0.73mm/px · 6 of 15 slices shown (1 of 2)]
[im 1/15]
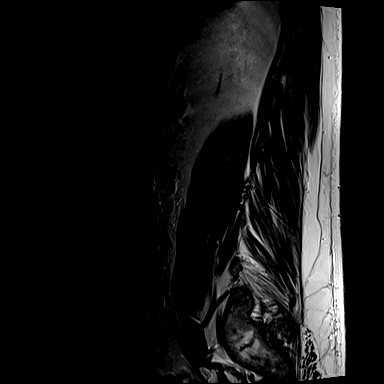
[im 3/15]
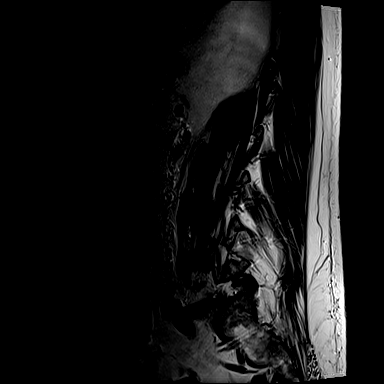
[im 6/15]
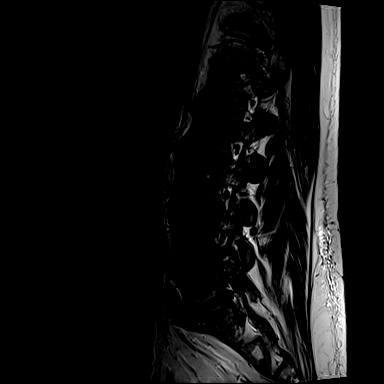
[im 9/15]
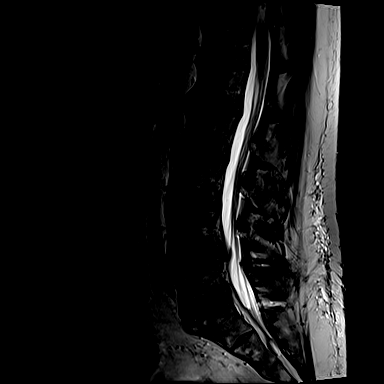
[im 12/15]
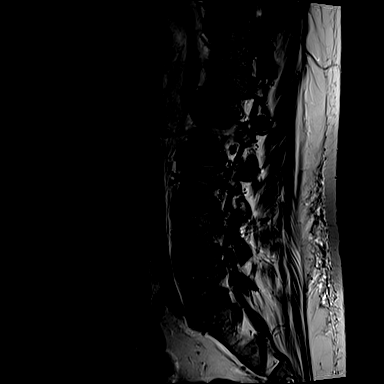
[im 15/15]
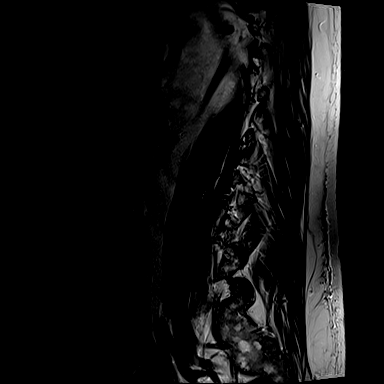

[Series 10: T1 · sagittal · 4.0mm · 0.73mm/px · 3 of 15 slices shown (1 of 2)]
[im 3/15]
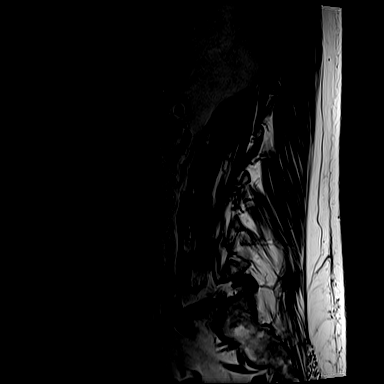
[im 9/15]
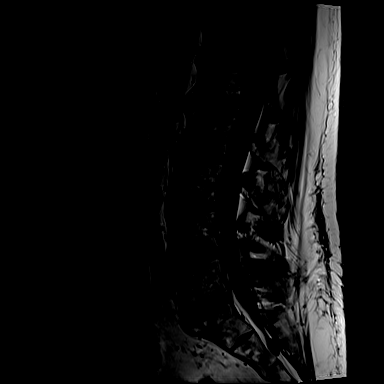
[im 15/15]
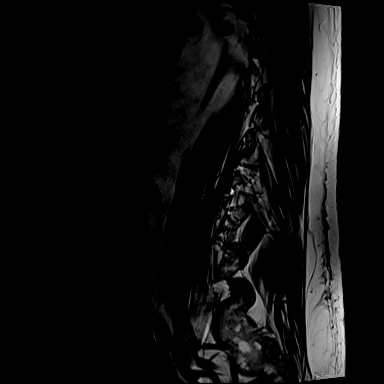

[Series 14: T1 · axial · 4.0mm · 0.28mm/px · z∈[-77,+75]mm · 3 of 39 slices shown (2 of 2)]
[im 6/39]
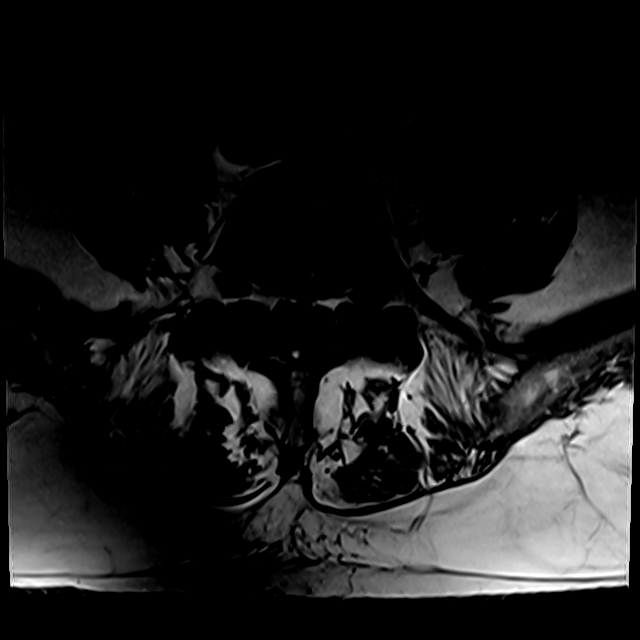
[im 20/39]
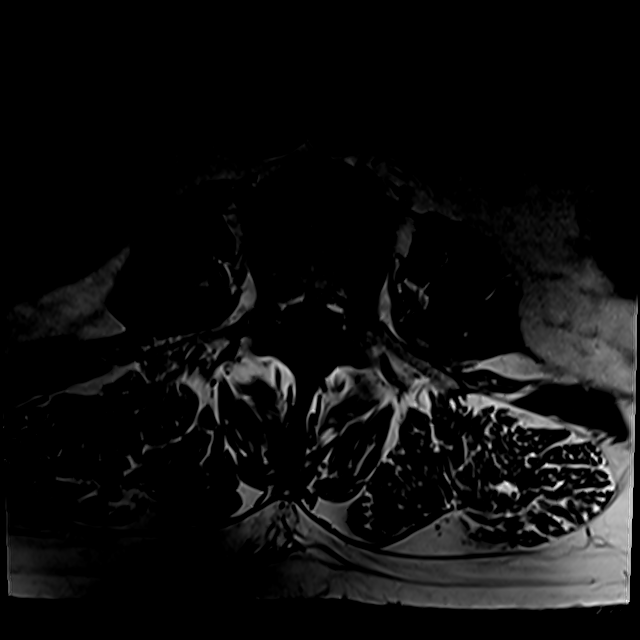
[im 33/39]
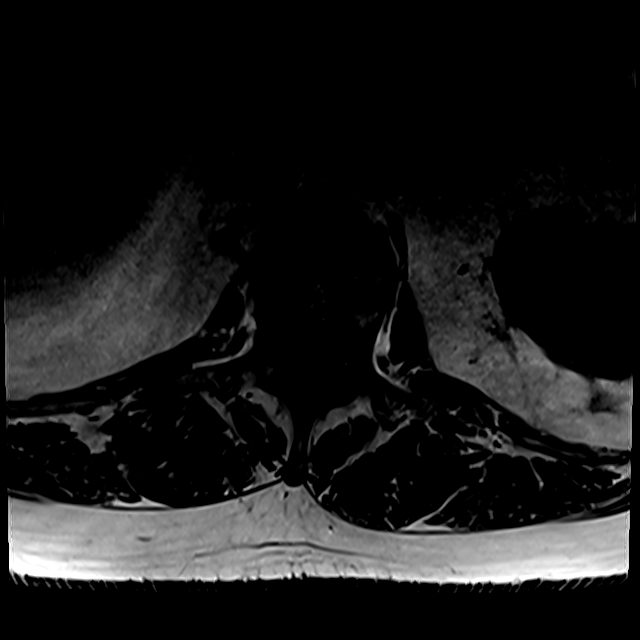

[Series 17: T2 · axial · 4.0mm · 0.28mm/px · z∈[-102,+75]mm · 6 of 39 slices shown (2 of 2)]
[im 1/39]
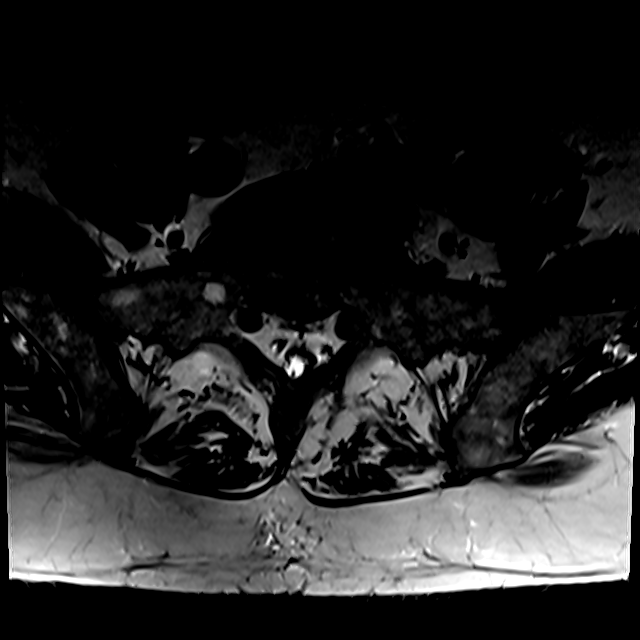
[im 6/39]
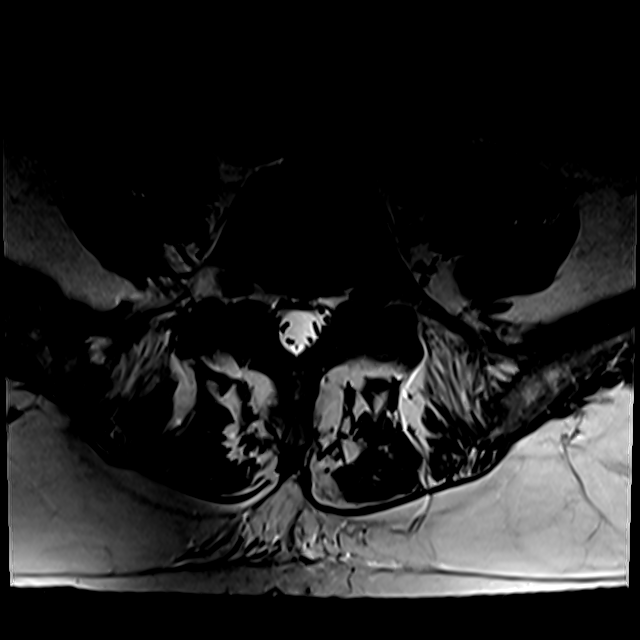
[im 11/39]
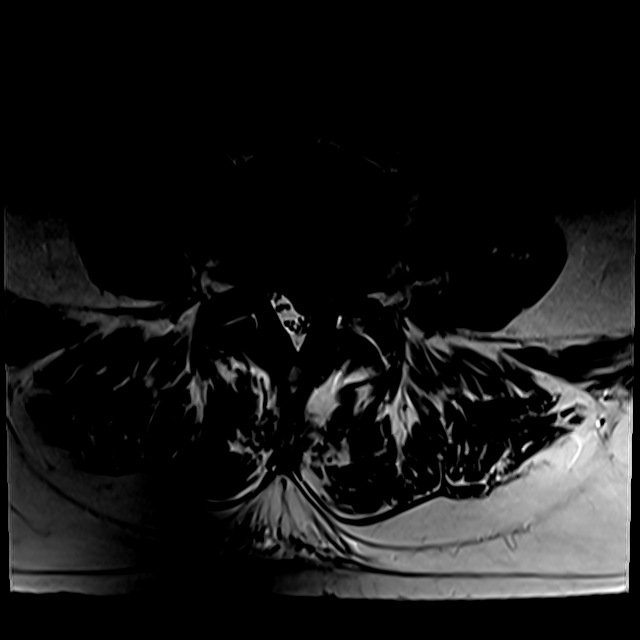
[im 17/39]
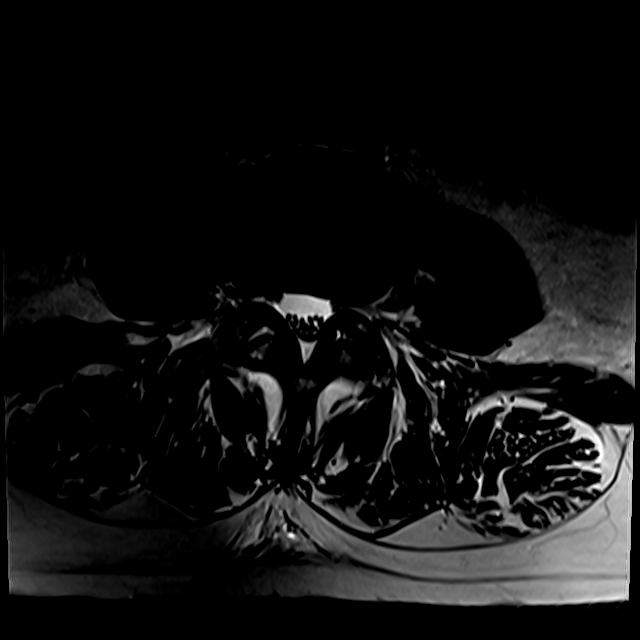
[im 20/39]
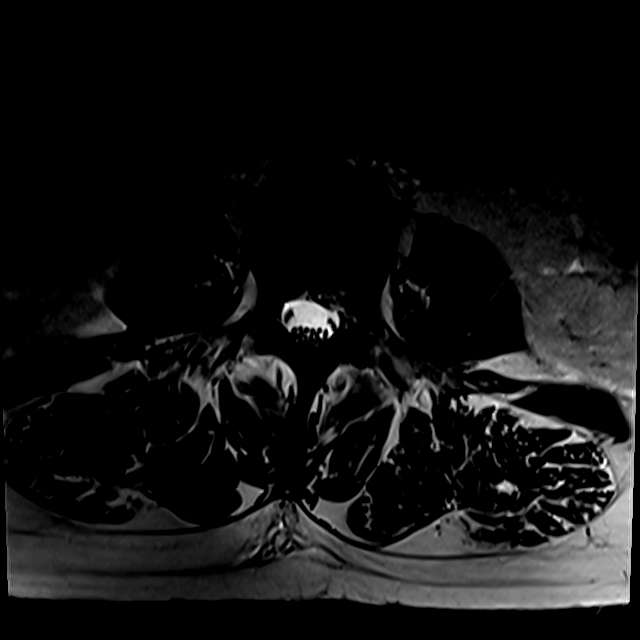
[im 33/39]
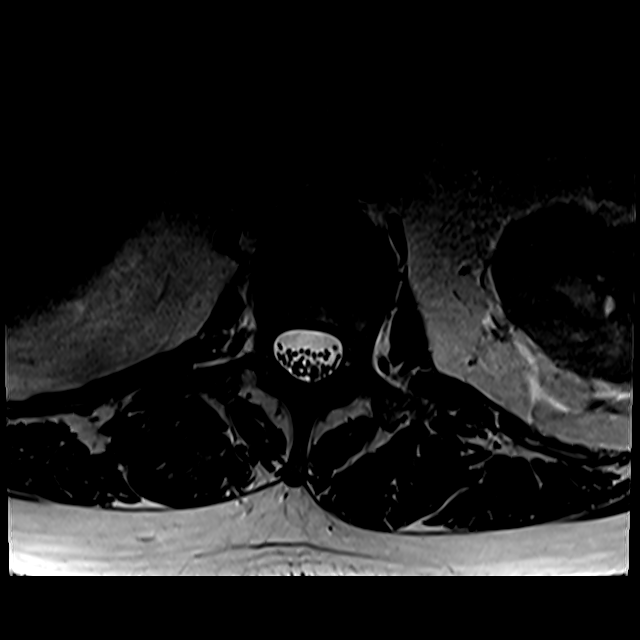

[18 of 48 positions shown; findings below may reference images not displayed]

FINDINGS: Segmentation:  Standard.

Alignment:  Mild convex left curvature.  No listhesis.

Vertebrae:  No fracture, evidence of discitis, or bone lesion.

Conus medullaris and cauda equina: Conus extends to the T12-L1
level. Conus and cauda equina appear normal.

Paraspinal and other soft tissues: Negative.

Disc levels:

T11-12 is imaged in the sagittal plane only and negative.

T12-L1: Loss of disc space height without bulge or protrusion. No
stenosis.

L1-2: Minimal disc bulge without stenosis.

L2-3: Mild-to-moderate facet degenerative change with small facet
joint effusions. No disc bulge or protrusion. No stenosis.

L3-4: Very shallow left paracentral protrusion. Ligamentum flavum
thickening and mild facet degenerative disease. There is minimal
narrowing in the left lateral recess but no nerve root compression.
Foramina open.

L4-5: There is a left paracentral and lateral recess protrusion
deforming the thecal sac and compressing the left L5 root. There is
also some impingement on the left S1 root. Moderate to moderately
severe central canal stenosis overall. Mild bilateral foraminal
narrowing.

L5-S1: Mild-to-moderate facet degenerative disease. There is a small
protrusion in the left foramen causing moderate stenosis. The
central canal and right foramen are open.
IMPRESSION: Left paracentral and lateral recess protrusion at L4-5 compresses
the left L5 root in the lateral recess and also impinges on the left
S1 root. There is moderate to moderately severe central canal
stenosis overall at L4-5 and mild bilateral foraminal narrowing.

Minimal narrowing left lateral recess at L3-4 where there is very
shallow left-sided disc protrusion. No nerve root compression.

Small protrusion in the left foramen at L5-S1 causes moderate left
foraminal stenosis.

## 2023-08-18 DIAGNOSIS — E1169 Type 2 diabetes mellitus with other specified complication: Secondary | ICD-10-CM | POA: Diagnosis not present

## 2023-08-18 DIAGNOSIS — Z125 Encounter for screening for malignant neoplasm of prostate: Secondary | ICD-10-CM | POA: Diagnosis not present

## 2023-08-18 DIAGNOSIS — Z0000 Encounter for general adult medical examination without abnormal findings: Secondary | ICD-10-CM | POA: Diagnosis not present

## 2023-08-18 DIAGNOSIS — R202 Paresthesia of skin: Secondary | ICD-10-CM | POA: Diagnosis not present

## 2023-08-18 DIAGNOSIS — I10 Essential (primary) hypertension: Secondary | ICD-10-CM | POA: Diagnosis not present

## 2023-08-18 DIAGNOSIS — E7800 Pure hypercholesterolemia, unspecified: Secondary | ICD-10-CM | POA: Diagnosis not present

## 2023-08-18 DIAGNOSIS — Z23 Encounter for immunization: Secondary | ICD-10-CM | POA: Diagnosis not present

## 2023-09-15 ENCOUNTER — Ambulatory Visit (INDEPENDENT_AMBULATORY_CARE_PROVIDER_SITE_OTHER)

## 2023-09-15 ENCOUNTER — Ambulatory Visit: Payer: Worker's Compensation | Admitting: Podiatry

## 2023-09-15 DIAGNOSIS — G5791 Unspecified mononeuropathy of right lower limb: Secondary | ICD-10-CM | POA: Diagnosis not present

## 2023-09-15 DIAGNOSIS — D3613 Benign neoplasm of peripheral nerves and autonomic nervous system of lower limb, including hip: Secondary | ICD-10-CM | POA: Diagnosis not present

## 2023-09-15 DIAGNOSIS — E1169 Type 2 diabetes mellitus with other specified complication: Secondary | ICD-10-CM

## 2023-09-15 DIAGNOSIS — Z794 Long term (current) use of insulin: Secondary | ICD-10-CM | POA: Diagnosis not present

## 2023-09-15 DIAGNOSIS — E119 Type 2 diabetes mellitus without complications: Secondary | ICD-10-CM | POA: Insufficient documentation

## 2023-09-15 NOTE — Progress Notes (Signed)
 Subjective:  Patient ID: Kerry Reynolds, male    DOB: 1955-08-01,  MRN: [id number] HPI Chief Complaint  Patient presents with   Numbness    Right foot between 3rd & 4th. Has concerns that this could be related to diabetes. Last A1c: 7.3. Takes asa 81 mg.     68 y.o. male presents with the above complaint.   ROS: Denies fever chills nausea vomit muscle aches pains calf pain back pain chest pain shortness of breath.  Past Medical History:  Diagnosis Date   Anxiety    Depression    Diabetes (HCC)    type II   Heart murmur    HTN (hypertension)    Hyperlipidemia    PONV (postoperative nausea and vomiting)    Sleep apnea    Tinnitus    Past Surgical History:  Procedure Laterality Date   BACK SURGERY     lower back   COLONOSCOPY     HERNIA REPAIR     [employer]   LACERATION REPAIR Right    arm   SHOULDER ARTHROSCOPY WITH OPEN ROTATOR CUFF REPAIR AND DISTAL CLAVICLE ACROMINECTOMY Right 03/06/2016   Procedure: SHOULDER ARTHROSCOPY WITH SUBACROMIAL DECOMPRESSION, DISTAL CLAVICLE RESECTION, MINI-OPEN ROTATOR CUFF REPAIR, POSSIBLE DERMASPAN PATCH;  Surgeon: Shirlee Dotter, MD;  Location: [facility] OR;  Service: Orthopedics;  Laterality: Right;  Needs RNFA   WISDOM TOOTH EXTRACTION      Current Outpatient Medications:    amLODipine-valsartan (EXFORGE) 5-320 MG tablet, Take 1 tablet by mouth daily., Disp: , Rfl:    aspirin 81 MG tablet, Take 81 mg by mouth daily., Disp: , Rfl:    buPROPion (WELLBUTRIN XL) 300 MG 24 hr tablet, Take 1 tablet by mouth daily., Disp: , Rfl:    FLUoxetine (PROZAC) 20 MG capsule, Take 1 capsule by mouth daily., Disp: , Rfl:    hydrochlorothiazide (HYDRODIURIL) 25 MG tablet, Take 12.5 mg by mouth daily. , Disp: , Rfl:    Multiple Vitamin (MULTIVITAMIN) capsule, Take by mouth., Disp: , Rfl:    pravastatin (PRAVACHOL) 40 MG tablet, Take 1 tablet by mouth every evening. , Disp: , Rfl:    Semaglutide (OZEMPIC, 0.25 OR 0.5 MG/DOSE, [place]), Inject into the skin., Disp: ,  Rfl:    SYNJARDY XR 12.08-998 MG TB24, Take 1 tablet by mouth 2 (two) times daily., Disp: , Rfl:   Allergies  Allergen Reactions   Neosporin [Neomycin-Bacitracin Zn-Polymyx] Other (See Comments)    Causes inflammation around area that it is applied    Review of Systems Objective:  There were no vitals filed for this visit.  General: Well developed, nourished, in no acute distress, alert and oriented x3   Dermatological: Skin is warm, dry and supple bilateral. Nails x 10 are well maintained; remaining integument appears unremarkable at this time. There are no open sores, no preulcerative lesions, no rash or signs of infection present.  Vascular: Dorsalis Pedis artery and Posterior Tibial artery pedal pulses are 2/4 bilateral with immedate capillary fill time. Pedal hair growth present. No varicosities and no lower extremity edema present bilateral.   Neruologic: Grossly intact via light touch bilateral. Vibratory intact via tuning fork bilateral. Protective threshold with Semmes Wienstein monofilament intact to all pedal sites bilateral. Patellar and Achilles deep tendon reflexes 2+ bilateral. No Babinski or clonus noted bilateral.  No reproducible pain on palpation  Musculoskeletal: No gross boney pedal deformities bilateral. No pain, crepitus, or limitation noted with foot and ankle range of motion bilateral. Muscular strength 5/5 in  all groups tested bilateral.  No reproducible pain on palpation  Gait: Unassisted, Nonantalgic.    Radiographs:  Radiographs taken today of the right foot demonstrate osseously mature foot no significant osseous abnormalities 3rd and 4th digits of the right foot.  Assessment & Plan:   Assessment: Neuritis.  Plan: He will follow-up with me if this worsens.     Barnabas Henriques T. Svensen, [place]

## 2023-09-29 DIAGNOSIS — K080 Exfoliation of teeth due to systemic causes: Secondary | ICD-10-CM | POA: Diagnosis not present

## 2023-10-26 DIAGNOSIS — G4733 Obstructive sleep apnea (adult) (pediatric): Secondary | ICD-10-CM | POA: Diagnosis not present

## 2024-01-26 DIAGNOSIS — G4733 Obstructive sleep apnea (adult) (pediatric): Secondary | ICD-10-CM | POA: Diagnosis not present

## 2024-02-03 DIAGNOSIS — G4733 Obstructive sleep apnea (adult) (pediatric): Secondary | ICD-10-CM | POA: Diagnosis not present

## 2024-02-19 DIAGNOSIS — E119 Type 2 diabetes mellitus without complications: Secondary | ICD-10-CM | POA: Diagnosis not present

## 2024-02-19 DIAGNOSIS — H43393 Other vitreous opacities, bilateral: Secondary | ICD-10-CM | POA: Diagnosis not present

## 2024-02-22 DIAGNOSIS — I10 Essential (primary) hypertension: Secondary | ICD-10-CM | POA: Diagnosis not present

## 2024-02-22 DIAGNOSIS — E1169 Type 2 diabetes mellitus with other specified complication: Secondary | ICD-10-CM | POA: Diagnosis not present

## 2024-02-22 DIAGNOSIS — F330 Major depressive disorder, recurrent, mild: Secondary | ICD-10-CM | POA: Diagnosis not present

## 2024-02-22 DIAGNOSIS — E7800 Pure hypercholesterolemia, unspecified: Secondary | ICD-10-CM | POA: Diagnosis not present

## 2024-02-24 DIAGNOSIS — G4733 Obstructive sleep apnea (adult) (pediatric): Secondary | ICD-10-CM | POA: Diagnosis not present
